# Patient Record
Sex: Female | Born: 1940 | Race: White | Hispanic: No | Smoking: Never smoker
Health system: Southern US, Community
[De-identification: ages and names within clinical notes are randomized; demographics above are authoritative.]

## PROBLEM LIST (undated history)

## (undated) DIAGNOSIS — E079 Disorder of thyroid, unspecified: Secondary | ICD-10-CM

## (undated) DIAGNOSIS — E119 Type 2 diabetes mellitus without complications: Secondary | ICD-10-CM

## (undated) DIAGNOSIS — I639 Cerebral infarction, unspecified: Secondary | ICD-10-CM

## (undated) DIAGNOSIS — F0390 Unspecified dementia without behavioral disturbance: Secondary | ICD-10-CM

## (undated) HISTORY — PX: ARTERIAL BYPASS SURGRY: SHX557

## (undated) HISTORY — PX: ABDOMINAL HYSTERECTOMY: SHX81

---

## 2005-01-14 ENCOUNTER — Ambulatory Visit: Payer: Self-pay | Admitting: Family Medicine

## 2005-01-16 ENCOUNTER — Ambulatory Visit: Payer: Self-pay | Admitting: Family Medicine

## 2005-04-23 ENCOUNTER — Ambulatory Visit: Payer: Self-pay | Admitting: Family Medicine

## 2006-07-31 ENCOUNTER — Emergency Department: Payer: Self-pay | Admitting: Emergency Medicine

## 2006-07-31 ENCOUNTER — Other Ambulatory Visit: Payer: Self-pay

## 2006-08-26 ENCOUNTER — Encounter: Payer: Self-pay | Admitting: Orthopedic Surgery

## 2006-09-15 ENCOUNTER — Encounter: Payer: Self-pay | Admitting: Orthopedic Surgery

## 2006-10-15 ENCOUNTER — Encounter: Payer: Self-pay | Admitting: Orthopedic Surgery

## 2006-11-15 ENCOUNTER — Encounter: Payer: Self-pay | Admitting: Orthopedic Surgery

## 2006-12-30 ENCOUNTER — Ambulatory Visit: Payer: Self-pay | Admitting: Family Medicine

## 2007-08-13 ENCOUNTER — Emergency Department: Payer: Self-pay | Admitting: Emergency Medicine

## 2007-08-19 ENCOUNTER — Emergency Department: Payer: Self-pay | Admitting: Emergency Medicine

## 2008-03-08 ENCOUNTER — Ambulatory Visit: Payer: Self-pay | Admitting: Internal Medicine

## 2008-03-30 ENCOUNTER — Ambulatory Visit: Payer: Self-pay | Admitting: Family Medicine

## 2008-05-03 ENCOUNTER — Inpatient Hospital Stay: Payer: Self-pay | Admitting: Vascular Surgery

## 2008-05-09 ENCOUNTER — Encounter: Payer: Self-pay | Admitting: Internal Medicine

## 2008-05-16 ENCOUNTER — Encounter: Payer: Self-pay | Admitting: Internal Medicine

## 2008-05-24 ENCOUNTER — Ambulatory Visit: Payer: Self-pay | Admitting: Family Medicine

## 2008-06-16 ENCOUNTER — Encounter: Payer: Self-pay | Admitting: Internal Medicine

## 2008-08-05 ENCOUNTER — Ambulatory Visit: Payer: Self-pay | Admitting: Family Medicine

## 2008-12-29 ENCOUNTER — Ambulatory Visit: Payer: Self-pay | Admitting: Family Medicine

## 2009-11-27 ENCOUNTER — Ambulatory Visit: Payer: Self-pay | Admitting: Family Medicine

## 2011-12-29 ENCOUNTER — Inpatient Hospital Stay: Payer: Self-pay | Admitting: Internal Medicine

## 2011-12-29 LAB — CBC
HGB: 12.5 g/dL (ref 12.0–16.0)
MCH: 29.2 pg (ref 26.0–34.0)
MCHC: 32.2 g/dL (ref 32.0–36.0)
MCV: 91 fL (ref 80–100)
RBC: 4.27 10*6/uL (ref 3.80–5.20)
RDW: 12.7 % (ref 11.5–14.5)
WBC: 10.5 10*3/uL (ref 3.6–11.0)

## 2011-12-29 LAB — URINALYSIS, COMPLETE
Glucose,UR: >=500 mg/dL (ref 0–75)
Ketone: NEGATIVE
Ph: 5 (ref 4.5–8.0)
Protein: 100
RBC,UR: 58 /HPF (ref 0–5)

## 2011-12-29 LAB — COMPREHENSIVE METABOLIC PANEL
Albumin: 2.9 g/dL — ABNORMAL LOW (ref 3.4–5.0)
Anion Gap: 11 (ref 7–16)
BUN: 29 mg/dL — ABNORMAL HIGH (ref 7–18)
Bilirubin,Total: 1.4 mg/dL — ABNORMAL HIGH (ref 0.2–1.0)
Chloride: 101 mmol/L (ref 98–107)
Co2: 26 mmol/L (ref 21–32)
Creatinine: 1.61 mg/dL — ABNORMAL HIGH (ref 0.60–1.30)
EGFR (African American): 37 — ABNORMAL LOW
Osmolality: 292 (ref 275–301)
Potassium: 4 mmol/L (ref 3.5–5.1)
SGOT(AST): 19 U/L (ref 15–37)
SGPT (ALT): 12 U/L
Total Protein: 7.2 g/dL (ref 6.4–8.2)

## 2011-12-29 LAB — DIFFERENTIAL
Basophil #: 0 10*3/uL (ref 0.0–0.1)
Basophil %: 0.3 %
Eosinophil #: 0 10*3/uL (ref 0.0–0.7)
Eosinophil %: 0 %
Lymphocyte #: 0.7 10*3/uL — ABNORMAL LOW (ref 1.0–3.6)
Monocyte #: 1 x10 3/mm — ABNORMAL HIGH (ref 0.2–0.9)
Neutrophil #: 8.8 10*3/uL — ABNORMAL HIGH (ref 1.4–6.5)

## 2011-12-30 LAB — BASIC METABOLIC PANEL
Anion Gap: 9 (ref 7–16)
BUN: 26 mg/dL — ABNORMAL HIGH (ref 7–18)
Creatinine: 1.6 mg/dL — ABNORMAL HIGH (ref 0.60–1.30)
EGFR (African American): 37 — ABNORMAL LOW
EGFR (Non-African Amer.): 32 — ABNORMAL LOW
Sodium: 140 mmol/L (ref 136–145)

## 2011-12-31 LAB — CBC WITH DIFFERENTIAL/PLATELET
Basophil #: 0 10*3/uL (ref 0.0–0.1)
Basophil %: 0.1 %
Eosinophil #: 0 10*3/uL (ref 0.0–0.7)
Eosinophil %: 0 %
HCT: 33.3 % — ABNORMAL LOW (ref 35.0–47.0)
Lymphocyte %: 8.2 %
MCV: 89 fL (ref 80–100)
Monocyte %: 9.4 %
Neutrophil #: 7.4 10*3/uL — ABNORMAL HIGH (ref 1.4–6.5)
Neutrophil %: 82.3 %
Platelet: 116 10*3/uL — ABNORMAL LOW (ref 150–440)
RBC: 3.74 10*6/uL — ABNORMAL LOW (ref 3.80–5.20)
RDW: 13 % (ref 11.5–14.5)
WBC: 9 10*3/uL (ref 3.6–11.0)

## 2011-12-31 LAB — BASIC METABOLIC PANEL
Calcium, Total: 7.8 mg/dL — ABNORMAL LOW (ref 8.5–10.1)
Chloride: 105 mmol/L (ref 98–107)
Co2: 23 mmol/L (ref 21–32)
Creatinine: 1.46 mg/dL — ABNORMAL HIGH (ref 0.60–1.30)
EGFR (African American): 41 — ABNORMAL LOW
Glucose: 237 mg/dL — ABNORMAL HIGH (ref 65–99)
Osmolality: 292 (ref 275–301)
Potassium: 3.8 mmol/L (ref 3.5–5.1)

## 2011-12-31 LAB — LIPID PANEL
HDL Cholesterol: 38 mg/dL — ABNORMAL LOW (ref 40–60)
Triglycerides: 90 mg/dL (ref 0–200)

## 2011-12-31 LAB — TSH: Thyroid Stimulating Horm: 10.7 u[IU]/mL — ABNORMAL HIGH

## 2011-12-31 LAB — HEMOGLOBIN A1C: Hemoglobin A1C: 6.7 % — ABNORMAL HIGH (ref 4.2–6.3)

## 2012-01-01 LAB — CBC WITH DIFFERENTIAL/PLATELET
Basophil #: 0 10*3/uL (ref 0.0–0.1)
Basophil %: 0.2 %
HCT: 34.6 % — ABNORMAL LOW (ref 35.0–47.0)
Lymphocyte #: 0.7 10*3/uL — ABNORMAL LOW (ref 1.0–3.6)
Lymphocyte %: 8.3 %
MCH: 29.9 pg (ref 26.0–34.0)
MCHC: 33.3 g/dL (ref 32.0–36.0)
Monocyte #: 0.9 x10 3/mm (ref 0.2–0.9)
Neutrophil #: 6.8 10*3/uL — ABNORMAL HIGH (ref 1.4–6.5)
RDW: 12.7 % (ref 11.5–14.5)
WBC: 8.4 10*3/uL (ref 3.6–11.0)

## 2012-01-01 LAB — BASIC METABOLIC PANEL
BUN: 26 mg/dL — ABNORMAL HIGH (ref 7–18)
Co2: 24 mmol/L (ref 21–32)
Creatinine: 1.57 mg/dL — ABNORMAL HIGH (ref 0.60–1.30)
EGFR (African American): 38 — ABNORMAL LOW
EGFR (Non-African Amer.): 33 — ABNORMAL LOW
Glucose: 216 mg/dL — ABNORMAL HIGH (ref 65–99)
Potassium: 3.9 mmol/L (ref 3.5–5.1)
Sodium: 137 mmol/L (ref 136–145)

## 2012-01-02 LAB — BASIC METABOLIC PANEL
Calcium, Total: 8.3 mg/dL — ABNORMAL LOW (ref 8.5–10.1)
Chloride: 101 mmol/L (ref 98–107)
Creatinine: 1.91 mg/dL — ABNORMAL HIGH (ref 0.60–1.30)
Osmolality: 302 (ref 275–301)
Potassium: 4.2 mmol/L (ref 3.5–5.1)
Sodium: 136 mmol/L (ref 136–145)

## 2012-01-02 LAB — URINE CULTURE

## 2012-01-04 LAB — CULTURE, BLOOD (SINGLE)

## 2012-01-21 ENCOUNTER — Ambulatory Visit: Payer: Self-pay | Admitting: Emergency Medicine

## 2012-01-21 LAB — BASIC METABOLIC PANEL
Anion Gap: 8 (ref 7–16)
Chloride: 103 mmol/L (ref 98–107)
Co2: 25 mmol/L (ref 21–32)
Creatinine: 1.83 mg/dL — ABNORMAL HIGH (ref 0.60–1.30)
EGFR (African American): 31 — ABNORMAL LOW
Osmolality: 280 (ref 275–301)
Sodium: 136 mmol/L (ref 136–145)

## 2012-01-21 LAB — URINALYSIS, COMPLETE
Bilirubin,UR: NEGATIVE
Glucose,UR: NEGATIVE mg/dL (ref 0–75)
Nitrite: POSITIVE
Ph: 6 (ref 4.5–8.0)
Specific Gravity: 1.025 (ref 1.003–1.030)

## 2012-01-21 LAB — CBC WITH DIFFERENTIAL/PLATELET
Basophil #: 0 10*3/uL (ref 0.0–0.1)
Basophil %: 0.4 %
Eosinophil #: 0 10*3/uL (ref 0.0–0.7)
Eosinophil %: 0.2 %
HCT: 39.3 % (ref 35.0–47.0)
HGB: 13.3 g/dL (ref 12.0–16.0)
Lymphocyte %: 16.8 %
MCH: 30.4 pg (ref 26.0–34.0)
MCHC: 33.7 g/dL (ref 32.0–36.0)
MCV: 90 fL (ref 80–100)
Monocyte #: 0.6 x10 3/mm (ref 0.2–0.9)
Neutrophil #: 5.4 10*3/uL (ref 1.4–6.5)
RBC: 4.36 10*6/uL (ref 3.80–5.20)
RDW: 13.2 % (ref 11.5–14.5)

## 2012-01-24 LAB — URINE CULTURE

## 2014-10-03 NOTE — H&P (Signed)
PATIENT NAME:  Shannon Todd, Shannon Todd MR#:  [id number] DATE OF BIRTH:  1941-03-07  DATE OF ADMISSION:  12/29/2011  PRIMARY CARE PHYSICIAN: Shannon Ripper. Shannon Kaufman, MD [facility])  CHIEF COMPLAINT: Left foot swelling, tenderness, and feeling feverish status post cat bite.   HISTORY OF PRESENT ILLNESS: Shannon Todd is a 74 year old pleasant Caucasian female with history of diabetes mellitus type 2 and systemic hypertension. She was in her usual state of health until Sunday morning when she stepped by accident on her cat's tail resulting in a cat bite on the dorsum of the left foot. Subsequently, gradually the area got swollen and became tender associated with redness and the patient started to feel feverish and today she became sick. she started to vomit as well and she feels dehydrated. The patient was admitted for further evaluation after finding of development of cellulitis on her left foot. Her blood work-up also showed elevated BUN and creatinine consistent with likely dehydration and prerenal azotemia. There is also evidence of urinary tract infection; however, the patient is asymptomatic.   REVIEW OF SYSTEMS: CONSTITUTIONAL: She indicated that she has fever, few chills as well. She has mild fatigue. EYES: No blurring of vision. No double vision. ENT: No hearing impairment. No sore throat. No dysphagia. CARDIOVASCULAR: No chest pain. No shortness of breath. No syncope. RESPIRATORY: No cough. No sputum production. No shortness of breath. No chest pain. GASTROINTESTINAL: No abdominal pain but reports nausea and she had one episode of vomiting today. No diarrhea. GENITOURINARY: Denies any dysuria. No frequency of urination, but she has chronic urinary incontinence. MUSCULOSKELETAL: No joint pain or swelling other than her left foot swelling. No muscular pain or swelling. INTEGUMENTARY: She has skin rash or cellulitis over the dorsum of the left foot. No ulcers other than the four puncture wound sites over the left foot. There  is associated swelling as well. NEUROLOGY: No focal weakness. No seizure activity. No headache. PSYCHIATRY: No anxiety. No depression. ENDOCRINE: No polyuria or polydipsia. No heat or cold intolerance.   PAST MEDICAL HISTORY:  1. Systemic hypertension.  2. Diabetes mellitus, type II.  3. Coronary artery disease status post coronary artery bypass graft in 2001.  4. Hyperlipidemia.  5. History of gout.  6. Hypothyroidism.  7. Peripheral neuropathy.   PAST SURGICAL HISTORY:  1. Coronary artery bypass graft done at [facility] in April 2001.  2. Partial hysterectomy.   SOCIAL HABITS: Nonsmoker. No history of alcohol or drug abuse.   SOCIAL HISTORY: She is widowed, lives with her daughter.   FAMILY HISTORY: Her father died from massive heart attack at the age of 8. Her mother also died at age of 53 in a nursing home. She is unsure about the cause of her death.   ADMISSION MEDICATIONS:  1. Levothyroxine 50 mcg once a day. 2. Citalopram 10 mg once a day. 3. Allopurinol 100 mg once a day.   ALLERGIES: Sulfa causes skin rash. All the medical records report that she is allergic to penicillin, but she is not. The patient had tolerated penicillin well in the past, as she indicates to me.   PHYSICAL EXAMINATION:   VITAL SIGNS: Blood pressure 175/74, respiratory rate 18, pulse 80, temperature 99.1, and oxygen saturation 97%.   GENERAL APPEARANCE: Shannon Todd lying in bed in no acute distress.   HEAD AND NECK EXAMINATION: No pallor. No icterus. No cyanosis.   EARS, NOSE, AND THROAT: Hearing was normal. Nasal mucosa, lips, and tongue were normal.   EYES: Normal eyelids  and conjunctivae. Pupils were about 6 mm, equal and reactive to light.   NECK: Supple. Trachea at midline. No thyromegaly. No cervical lymphadenopathy. No masses.   HEART: Normal S1 and S2. No S3 or S4. No murmur. No gallop. No carotid bruits.   RESPIRATORY: Normal breathing pattern without use of accessory muscles. No rales. No  wheezing.   ABDOMEN: Soft without tenderness. No hepatosplenomegaly. No masses. No hernias.   SKIN: No ulcers. There is redness over the dorsum of the left foot associated with soft tissue swelling and there are four puncture wounds. The area is also tender to touch.   NEUROLOGIC: Cranial nerves II through XII are intact. No focal motor deficit.   PSYCHIATRY: The patient is alert and oriented x3. Mood and affect were normal.   LABORATORY FINDINGS: Sugar 287, BUN 29, creatinine 1.6, sodium 138, and potassium 4. Her estimated GFR is 32. Total protein 7.2, albumin 2.9, and bilirubin 1.4. Normal liver transaminases. CBC showed white count of 10,000, hemoglobin 12.5, hematocrit 38, and platelet count 129.   Urinalysis showed more than 500 glucose, 58 red blood cells, 772 white blood cells, and +2 bacteria.   ASSESSMENT:  1. Left foot cellulitis secondary to cat bite. 2. Urinary tract infection.  3. Dehydration and evidence of mild acute renal failure secondary to prerenal azotemia.  4. Mild thrombocytopenia. 5. Diabetes mellitus type-2, uncontrolled.  6. Systemic hypertension, uncontrolled.  7. Coronary artery disease, status post coronary artery bypass graft in 2001.  8. Hyperlipidemia.  9. History of gout.  10. History of peripheral neuropathy.   PLAN: The patient will be admitted to the medical floor. IV antibiotic was ordered using Unasyn. Two blood cultures were ordered. I will send also urine for culture and sensitivity. Accu-Chek and sliding scale. I will add ACE inhibitor since she is diabetic and also for better blood pressure control. Zofran p.r.n. for her nausea and vomiting. Gentle IV hydration. Repeat basic metabolic profile tomorrow morning to follow up on the BUN and creatinine. The patient indicates that she does not have a Living Will, but she had given the power of attorney to her daughter, Shannon Todd.   TIME SPENT EVALUATING THIS PATIENT: More than 55 minutes including  reviewing her medical records.  ____________________________ Carney CornersAmir M. Rudene Rearwish, MD amd:slb D: 12/29/2011 23:01:00 ET T: 12/30/2011 09:17:25 ET JOB#: [id number]  cc: Shannon RipperPaul C. Shannon Kaufmanobin, MD Carney CornersAmir M. Rudene Rearwish, MD, <Dictator> Zollie ScaleAMIR M Mimi Debellis MD ELECTRONICALLY SIGNED 12/30/2011 23:14

## 2014-10-03 NOTE — Discharge Summary (Signed)
PATIENT NAME:  Shannon Todd, Edwena M MR#:  [id number] DATE OF BIRTH:  1941-04-18  DATE OF ADMISSION:  12/29/2011 DATE OF DISCHARGE:  01/02/2012  PRIMARY CARE PHYSICIAN: Unclear at this point. She does have a primary care physician but she does not remember the name.  FINAL DIAGNOSES:  1. Cat bite cellulitis and abscess of the left foot, status post incision and drainage. Pasteurella growing out of culture.  2. Urinary tract infection with Klebsiella oxytoca resistant to ampicillin but sensitive to other antibiotics.  3. Diabetes.  4. Hypertension.  5. Chronic kidney disease.  6. Depression.  7. Gout attack, right knee.  OPERATIONS: Incision and drainage of the foot by Dr. Alberteen Spindleline, podiatry. That was done on 12/30/2011.   MEDICATIONS ON DISCHARGE:  1. Lexapro 10 mg daily.  2. Allopurinol 100 mg daily.  3. Levothyroxine increased to 75 mcg daily.  4. Prednisone taper start 20 mg and taper by 5 mg daily until complete.  5. Percocet 5/325 mg one tablet every four hours as needed for pain. 6. Lisinopril 10 mg daily. 7. Metoprolol 25 mg 1/2 tablet every 12 hours. 8. Augmentin 500/125 mg one tablet twice a day until complete, five more days. 9. Levaquin 250 mg until complete, another four days.  10. Januvia 50 mg daily.  11. Sliding scale.  DISCHARGE INSTRUCTIONS: Followup in 1 to 2 days with the doctor at rehab. Needs physical therapy.   DIET: Low sodium, low fat, low cholesterol diet.  HISTORY OF PRESENT ILLNESS: The patient was admitted 12/29/2011 and discharged 01/02/2012. She came in after left foot swelling and tenderness status post cat bite. She was admitted with cat bite cellulitis, urinary tract infection acute, and acute on chronic renal failure.   LABORATORY, DIAGNOSTIC AND RADIOLOGIC DATA: Blood cultures were negative.   Glucose 287, BUN 29, creatinine 1.61, sodium 138, potassium 4.0, chloride 101, CO2 26, and calcium 8.2. Liver function tests okay. Albumin down at 2.9. White blood  cell count 10.5, hemoglobin and hematocrit 12.5 and 38.7, and platelet count 129.   Urinalysis positive.  Left foot complete showed radiopaque foreign body in lateral aspect of overlying calcaneus.  Wound culture grew out heavy growth of gram-negative rod, as per microbiology it is Pasteurella. They are just trying to figure out whether ti is Pasteurella canis or multocida.   Urine culture grew out greater than 100,000 Klebsiella oxytoca resistant to ampicillin.   LDL 140, HDL 38, and triglycerides 90. Creatinine upon discharge 1.9 and BUN 41.  Right knee x-ray: Mild degenerative changes.  Platelet count 122 and white count upon discharge 8.4. Hemoglobin A1c was 6.7. TSH was 10.7.   HOSPITAL COURSE PER PROBLEM LIST: 1. For the patient's cat bite cellulitis and abscess of the left foot, status post incision and drainage by Dr. Alberteen Spindleline. Follow-up next week with him. Most likely this is growing out Pasteurella. The patient was initially started on Unasyn. This was switched over to Augmentin, complete a 10 day course.  2. Urinary tract infection resistant to the Unasyn that the patient was on. Have to give Levaquin to cover this and will give five days of this, another four days at the rehab.  3. Diabetes. We will add low dose Januvia, sliding scale. The patient's sugars are up secondary to steroids  I gave for the gout attack of the right knee.  4. Hypertension. Lisinopril and metoprolol were given.  5. Chronic kidney disease. Creatinine bounced around between 1.5 and 1.9. This is chronic kidney disease. With starting  the lisinopril recommend checking a BMP next week. If creatinine continues to rise may have to get rid of lisinopril.  6. Depression. The patient is on Celexa.  7. For acute gout attack, the patient was started on a few doses of Solu-Medrol. We will give a quick prednisone taper. The patient is on allopurinol, which is already renally dosed.  8. For the patient's thrombocytopenia,  continue to follow as an outpatient.  TIME SPENT ON DISCHARGE: 40 minutes.  ____________________________ Herschell Dimes. Renae Gloss, MD rjw:slb D: 01/02/2012 10:12:38 ET T: 01/02/2012 10:25:20 ET JOB#: [id number]  cc: Herschell Dimes. Renae Gloss, MD, <Dictator> Salley Scarlet MD ELECTRONICALLY SIGNED 01/03/2012 16:04

## 2014-10-03 NOTE — Op Note (Signed)
PATIENT NAME:  Shannon Todd, Shannon Todd MR#:  [id number] DATE OF BIRTH:  [birth year]  DATE OF PROCEDURE:  12/30/2011  PREOPERATIVE DIAGNOSIS: Cat bite puncture wound left foot with abscess.   POSTOPERATIVE DIAGNOSIS: Cat bite puncture wound left foot with abscess.   PROCEDURE: Incision and drainage left foot.   SURGEON: Linus Galasodd Mong Neal, DPM  ANESTHESIA: General endotracheal.   HEMOSTASIS: Pneumatic tourniquet left calf, 250 mmHg.   ESTIMATED BLOOD LOSS: Minimal.   PATHOLOGY: None.   DRAINS: 4 x 4 gauze inserted into two incision openings.   COMPLICATIONS: None apparent.   OPERATIVE INDICATIONS: This is a 74 year old female who this past weekend received a cat bite to the top of her left foot. Began having significant swelling and redness with pain in the left foot. Had some nausea and vomiting and presented to the Emergency Department and was admitted for IV antibiotics and evaluation and debridement.   DESCRIPTION OF PROCEDURE: Patient was taken to the Operating Room and placed on the table in the supine position. Following satisfactory general endotracheal anesthesia, a pneumatic tourniquet was applied at the level of the left calf and the left foot and leg were prepped and draped in the usual sterile fashion. The foot was elevationally exsanguinated and the tourniquet inflated to 250 mmHg.   Attention was then directed to the dorsal aspect of the left foot where an approximate 4 cm linear incision was made coursing distal to proximal over the dorsum of the foot just medial to the cat bite puncture wounds. The incision was deepened and connected via hemostat to the actual puncture wounds. Did not appear to be any significant pockets of purulence or necrotic tissue. The wound was then explored proximally along the tendon area and no evidence of abscess was noted proximally. Next, a small stab incision was made just medial to the proximal puncture wound and this was explored using hemostats. Just a small  amount of devitalized tissue around the actual puncture wound but none deep and again no pockets of purulence. Next, a small stab incision was made along the lateral distal puncture wound approximately 6 mm in length. This was open using hemostats and explored. All three wounds were then flushed using copious amounts of sterile saline, 3 liters, under pulse lavage irrigation. The central 4 cm incision was then almost completely closed using 4-0 nylon simple interrupted sutures with a 6 mm gap left open at the distal puncture wound. One simple interrupted suture was then placed along the medial stab wound with an opening again left for packing. The lateral stab incision was left completely open. Sterile saline gauze was then packed into the two wounds that were left open distal and medial and Xeroform and a sterile gauze applied to the foot followed by an ABD and a Kerlix. An Ace wrap was then applied. The tourniquet was released and blood flow noted to return immediately to all of the digits. Prior to bandaging of the foot 9 mL of 0.5% Sensorcaine plain was then injected around the incisions for postoperative anesthesia. Patient tolerated the procedure and anesthesia well and was transported to the postanesthesia care unit with vital signs stable and in good condition.   ____________________________ Linus Galasodd Destin Kittler, DPM tc:cms D: 12/30/2011 21:24:08 ET T: 12/31/2011 09:27:28 ET JOB#: [id number]  cc: Linus Galasodd Anorah Trias, DPM, <Dictator>  Magaret Justo DPM ELECTRONICALLY SIGNED 01/22/2012 14:50

## 2014-10-03 NOTE — Consult Note (Signed)
PATIENT NAME:  Shannon Todd, Shannon Todd MR#:  [id number] DATE OF BIRTH:  Apr 02, 1941  DATE OF CONSULTATION:  12/30/2011  REFERRING PHYSICIAN:   CONSULTING PHYSICIAN:  Linus Galasodd Delron Comer, DPM  REASON FOR CONSULTATION: Shannon Todd is a 74 year old diabetic female who this past weekend accidentally stepped on her cat's tail and received a bite on the top of her left foot. Subsequently she noticed some redness and swelling, and started to notice some nausea and vomiting. She also feels dehydrated. She presented to the Emergency Room and was admitted for cellulitis and evaluation.   PAST MEDICAL HISTORY:  1. Diabetes mellitus type 2.  2. Systemic hypertension.  3. Coronary artery disease status post coronary artery bypass graft 2001.  4. Hyperlipidemia.  5. History of gout.  6. Hypothyroidism.  7. Peripheral neuropathy.   PAST SURGICAL HISTORY:  1. Coronary artery bypass grafting 2001. 2. Partial hysterectomy.   ADMISSION MEDICATIONS:  1. Levothyroxine 50 mcg once a day. 2. Citalopram 10 mg once a day.  3. Allopurinol 100 mg once a day.   ALLERGIES: Sulfa.   SOCIAL HISTORY: She is widowed, lives with her daughter. Denies alcohol or tobacco use.   FAMILY HISTORY: Heart disease.   REVIEW OF SYSTEMS: Swelling and redness in the left foot with recent cat bite to the top of the foot. She does relate some numbness and paresthesias associated with her diabetes. Recent nausea and vomiting. Denies any cough or shortness of breath. No chest pain. Does currently also have a urinary tract infection. No musculoskeletal pains or aches other than the left foot. No hearing or vision changes.  PHYSICAL EXAMINATION:  VASCULAR: DP and PT pulses are fully palpable. Capillary filling time within normal limits.   NEUROLOGICAL: There is some loss of protective threshold with monofilament wire distally in the toes. Proprioception is also impaired. There is pain response on compression of the left foot.   INTEGUMENT: Skin is  warm, dry, and mildly atrophic. There is significant edema and erythema in the left foot. Four puncture wounds are noted on the dorsum of the foot corresponding to the cat's teeth. Upon light debridement there is purulence that is expressed from each of the puncture wounds. A culture was taken for sensitivities.   MUSCULOSKELETAL: Guarded range of motion on the left. Pain on palpation and motion. Otherwise muscle testing and range of motion is within normal limits.   X-RAYS:  Normal joint space pattern throughout the foot. There is noted to be a retained needle foreign body at the plantar aspect of the calcaneus. This is not correlating with her clinical area of infection. No bony destruction or lytic lesions are noted.   LAB WORK:  The patient is admitted with white count in the normal range at the high end, but she does have a left shift in her neutrophils. Platelet count is mildly decreased at 129.   IMPRESSION:  1. Cat bite with cellulitis and abscess left foot.  2. Diabetes with neuropathy.  3. Retained foreign body left heel. No correlation with her current admission.   PLAN: Opened up the areas and a culture was taken for sensitivities. Betadine and a light dressing was applied. I spoke with the patient and a couple of her daughters about the need to open up the puncture wounds to drain any retained purulence. We will plan on this later on this evening. N.p.o. at this point and we will obtain a consent form for incision and drainage.      We will follow  her accordingly following her incision and drainage.     ____________________________ Linus Galas, DPM tc:bjt D: 12/30/2011 13:15:33 ET T: 12/30/2011 14:20:02 ET JOB#: [id number]  cc: Linus Galas, DPM, <Dictator> Calla Wedekind DPM ELECTRONICALLY SIGNED 12/31/2011 7:43

## 2015-04-02 ENCOUNTER — Ambulatory Visit
Admission: EM | Admit: 2015-04-02 | Discharge: 2015-04-02 | Disposition: A | Discharge status: Home / Self Care | Payer: Medicare Other | Source: Home / Self Care | Attending: Family Medicine | Admitting: Family Medicine

## 2015-04-02 ENCOUNTER — Encounter: Payer: Self-pay | Admitting: *Deleted

## 2015-04-02 ENCOUNTER — Emergency Department
Admission: EM | Admit: 2015-04-02 | Discharge: 2015-04-03 | Disposition: A | Discharge status: Home / Self Care | Payer: Medicare Other | Attending: Emergency Medicine | Admitting: Emergency Medicine

## 2015-04-02 DIAGNOSIS — R4182 Altered mental status, unspecified: Secondary | ICD-10-CM | POA: Diagnosis not present

## 2015-04-02 DIAGNOSIS — Z7902 Long term (current) use of antithrombotics/antiplatelets: Secondary | ICD-10-CM | POA: Insufficient documentation

## 2015-04-02 DIAGNOSIS — Z7984 Long term (current) use of oral hypoglycemic drugs: Secondary | ICD-10-CM | POA: Diagnosis not present

## 2015-04-02 DIAGNOSIS — Z7982 Long term (current) use of aspirin: Secondary | ICD-10-CM | POA: Diagnosis not present

## 2015-04-02 DIAGNOSIS — N390 Urinary tract infection, site not specified: Secondary | ICD-10-CM | POA: Insufficient documentation

## 2015-04-02 DIAGNOSIS — R350 Frequency of micturition: Secondary | ICD-10-CM | POA: Diagnosis present

## 2015-04-02 DIAGNOSIS — E119 Type 2 diabetes mellitus without complications: Secondary | ICD-10-CM | POA: Diagnosis not present

## 2015-04-02 DIAGNOSIS — Z79899 Other long term (current) drug therapy: Secondary | ICD-10-CM | POA: Insufficient documentation

## 2015-04-02 HISTORY — DX: Disorder of thyroid, unspecified: E07.9

## 2015-04-02 HISTORY — DX: Unspecified dementia, unspecified severity, without behavioral disturbance, psychotic disturbance, mood disturbance, and anxiety: F03.90

## 2015-04-02 HISTORY — DX: Cerebral infarction, unspecified: I63.9

## 2015-04-02 HISTORY — DX: Type 2 diabetes mellitus without complications: E11.9

## 2015-04-02 LAB — CBC
HCT: 37.2 % (ref 35.0–47.0)
HEMOGLOBIN: 12.3 g/dL (ref 12.0–16.0)
MCH: 30.9 pg (ref 26.0–34.0)
MCHC: 33.2 g/dL (ref 32.0–36.0)
MCV: 93.1 fL (ref 80.0–100.0)
PLATELETS: 153 10*3/uL (ref 150–440)
RBC: 4 MIL/uL (ref 3.80–5.20)
RDW: 14.5 % (ref 11.5–14.5)
WBC: 9 10*3/uL (ref 3.6–11.0)

## 2015-04-02 LAB — URINALYSIS COMPLETE WITH MICROSCOPIC (ARMC ONLY)
Bilirubin Urine: NEGATIVE
Glucose, UA: NEGATIVE mg/dL
Ketones, ur: NEGATIVE mg/dL
Nitrite: NEGATIVE
PROTEIN: 100 mg/dL — AB
SPECIFIC GRAVITY, URINE: 1.016 (ref 1.005–1.030)
pH: 5 (ref 5.0–8.0)

## 2015-04-02 LAB — BASIC METABOLIC PANEL
Anion gap: 9 (ref 5–15)
BUN: 37 mg/dL — AB (ref 6–20)
CHLORIDE: 102 mmol/L (ref 101–111)
CO2: 26 mmol/L (ref 22–32)
CREATININE: 1.89 mg/dL — AB (ref 0.44–1.00)
Calcium: 9.1 mg/dL (ref 8.9–10.3)
GFR, EST AFRICAN AMERICAN: 29 mL/min — AB (ref >60)
GFR, EST NON AFRICAN AMERICAN: 25 mL/min — AB (ref >60)
Glucose, Bld: 104 mg/dL — ABNORMAL HIGH (ref 65–99)
POTASSIUM: 3.9 mmol/L (ref 3.5–5.1)
SODIUM: 137 mmol/L (ref 135–145)

## 2015-04-02 MED ORDER — CEPHALEXIN 500 MG PO CAPS: 500.0000 mg | ORAL_CAPSULE | Freq: Three times a day (TID) | ORAL | Status: DC

## 2015-04-02 MED ORDER — CEPHALEXIN 500 MG PO CAPS
500.0000 mg | ORAL_CAPSULE | Freq: Once | ORAL | Status: AC
  Administered 2015-04-03: 500 mg via ORAL
  Filled 2015-04-02: qty 1

## 2015-04-02 NOTE — ED Provider Notes (Signed)
[facility] Emergency Department Provider Note  Time seen: 9:40 PM  I have reviewed the triage vital signs and the nursing notes.   HISTORY  Chief Complaint Urinary Frequency    HPI Maryruth BunJanice M Constancio is a 74 y.o. female with a past medical history of diabetes, CVA, dementia, presents to the emergency department for a possible urinary tract infection. The patient lives with her daughter, according to the family the past several days they've noted a very strong and foul-smelling odor to her urine. Patient denies any dysuria, fever, nausea, vomiting, diarrhea, abdominal pain. Patient denies any symptoms at all, but the daughter states there is definitely very foul odor to her urine.    Past Medical History  Diagnosis Date  . Diabetes mellitus without complication (HCC)   . Thyroid disease   . Stroke (HCC)   . Dementia     There are no active problems to display for this patient.   Past Surgical History  Procedure Laterality Date  . Arterial bypass surgry    . Abdominal hysterectomy      Current Outpatient Rx  Name  Route  Sig  Dispense  Refill  . allopurinol (ZYLOPRIM) 300 MG tablet   Oral   Take 300 mg by mouth daily.         Marland Kitchen. amLODipine (NORVASC) 10 MG tablet   Oral   Take 10 mg by mouth daily.         Marland Kitchen. aspirin 81 MG tablet   Oral   Take 81 mg by mouth daily.         . Cholecalciferol (VITAMIN D3) 1000 UNITS CAPS   Oral   Take by mouth.         . citalopram (CELEXA) 10 MG tablet   Oral   Take 10 mg by mouth daily.         . clopidogrel (PLAVIX) 75 MG tablet   Oral   Take 75 mg by mouth daily.         Marland Kitchen. dicyclomine (BENTYL) 20 MG tablet   Oral   Take 20 mg by mouth 3 (three) times daily before meals.         . donepezil (ARICEPT) 23 MG TABS tablet   Oral   Take 23 mg by mouth at bedtime.         Marland Kitchen. glipiZIDE (GLUCOTROL XL) 2.5 MG 24 hr tablet   Oral   Take 2.5 mg by mouth daily with breakfast.         .  haloperidol (HALDOL) 0.5 MG tablet   Oral   Take 0.5 mg by mouth 2 (two) times daily. 1-2 tablets by mouth every evening for confusion         . levothyroxine (SYNTHROID, LEVOTHROID) 112 MCG tablet   Oral   Take 112 mcg by mouth daily before breakfast.         . memantine (NAMENDA XR) 7 MG CP24 24 hr capsule   Oral   Take 7 mg by mouth daily.         Marland Kitchen. omeprazole (PRILOSEC) 20 MG capsule   Oral   Take 20 mg by mouth daily.         Marland Kitchen. oxybutynin (DITROPAN) 5 MG tablet   Oral   Take 5 mg by mouth 2 (two) times daily.         . promethazine (PHENERGAN) 12.5 MG tablet   Oral   Take 12.5 mg by mouth every  6 (six) hours as needed for nausea or vomiting.         . simvastatin (ZOCOR) 40 MG tablet   Oral   Take 40 mg by mouth daily.           Allergies Sulfa antibiotics  No family history on file.  Social History Social History  Substance Use Topics  . Smoking status: Never Smoker   . Smokeless tobacco: Not on file  . Alcohol Use: No    Review of Systems Constitutional: Negative for fever. Cardiovascular: Negative for chest pain. Respiratory: Negative for shortness of breath. Gastrointestinal: Negative for abdominal pain, nausea, vomiting, diarrhea. Genitourinary: Negative for dysuria. Positive for foul smell. Musculoskeletal: Negative for back pain. 10-point ROS otherwise negative.  ____________________________________________   PHYSICAL EXAM:  VITAL SIGNS: ED Triage Vitals  Enc Vitals Group     BP 04/02/15 1942 170/87 mmHg     Pulse Rate 04/02/15 1942 87     Resp 04/02/15 1942 18     Temp 04/02/15 1942 98.6 F (37 C)     Temp Source 04/02/15 1942 Oral     SpO2 04/02/15 1942 96 %     Weight 04/02/15 1942 200 lb (90.719 kg)     Height --      Head Cir --      Peak Flow --      Pain Score --      Pain Loc --      Pain Edu? --      Excl. in GC? --    Constitutional: Alert. Well appearing and in no distress. Eyes: Normal exam ENT    Head: Normocephalic and atraumatic.   Mouth/Throat: Mucous membranes are moist. Cardiovascular: Normal rate, regular rhythm. No murmur Respiratory: Normal respiratory effort without tachypnea nor retractions. Breath sounds are clear Gastrointestinal: Soft and nontender. No distention.   Neurologic:  Normal speech and language. No gross focal neurologic deficits Skin:  Skin is warm, dry and intact.  Psychiatric: Mood and affect are normal. Speech and behavior are normal. ____________________________________________    INITIAL IMPRESSION / ASSESSMENT AND PLAN / ED COURSE  Pertinent labs & imaging results that were available during my care of the patient were reviewed by me and considered in my medical decision making (see chart for details).  Patient presents for possible urinary tract infection. Labs are largely within normal limits, currently awaiting urinalysis results. Nontender abdominal exam. Patient denies any pain.  Urinalysis consistent with urinary tract infection. We'll discharge on antibiotics. Family agreeable to plan.   ____________________________________________   FINAL CLINICAL IMPRESSION(S) / ED DIAGNOSES  Urinary tract infection   Minna Antis, MD 04/02/15 2316

## 2015-04-02 NOTE — Discharge Instructions (Signed)

## 2015-04-02 NOTE — ED Notes (Signed)
Report called to Tammy SoursGreg RN at [facility] ED.

## 2015-04-02 NOTE — ED Notes (Signed)
Pt unable to obtain urine specimen at this time 

## 2015-04-02 NOTE — ED Notes (Signed)
Pt in with co foul smelling urine today, has also had increased sleeping and confusion.

## 2015-04-02 NOTE — ED Notes (Signed)
Pt's daughter states that pt has been sleeping a lot, has foul smelling urine, wetting the bed and extreme confusion, outside of her normal dementia.  Started less than a week ago

## 2015-04-02 NOTE — ED Provider Notes (Signed)
CSN: [id number]     Arrival date & time 04/02/15  1745 History   None    Chief Complaint  Patient presents with  . Altered Mental Status  . Nocturnal Enuresis   (Consider location/radiation/quality/duration/timing/severity/associated sxs/prior Treatment) HPI Comments: 74 yo female with a h/o diabetes, dementia, stroke presents with daughter who brings patient due to an approximately 4-5 days h/o worsening confusion, bedwetting and foul smelling urine. Daughter reports patient has also been sleeping a lot; more than usual. Daughter states patient has had prior admissions to the hospital with similar symptoms and UTI.  Patient is a 74 y.o. female presenting with altered mental status. The history is provided by a relative.  Altered Mental Status   Past Medical History  Diagnosis Date  . Diabetes mellitus without complication (HCC)   . Thyroid disease   . Stroke (HCC)   . Dementia    Past Surgical History  Procedure Laterality Date  . Arterial bypass surgry    . Abdominal hysterectomy     No family history on file. Social History  Substance Use Topics  . Smoking status: Never Smoker   . Smokeless tobacco: None  . Alcohol Use: No   OB History    No data available     Review of Systems  Allergies  Sulfa antibiotics  Home Medications   Prior to Admission medications   Medication Sig Start Date End Date Taking? Authorizing Provider  allopurinol (ZYLOPRIM) 300 MG tablet Take 300 mg by mouth daily.   Yes Historical Provider, MD  amLODipine (NORVASC) 10 MG tablet Take 10 mg by mouth daily.   Yes Historical Provider, MD  aspirin 81 MG tablet Take 81 mg by mouth daily.   Yes Historical Provider, MD  Cholecalciferol (VITAMIN D3) 1000 UNITS CAPS Take by mouth.   Yes Historical Provider, MD  citalopram (CELEXA) 10 MG tablet Take 10 mg by mouth daily.   Yes Historical Provider, MD  clopidogrel (PLAVIX) 75 MG tablet Take 75 mg by mouth daily.   Yes Historical Provider, MD   dicyclomine (BENTYL) 20 MG tablet Take 20 mg by mouth 3 (three) times daily before meals.   Yes Historical Provider, MD  donepezil (ARICEPT) 23 MG TABS tablet Take 23 mg by mouth at bedtime.   Yes Historical Provider, MD  glipiZIDE (GLUCOTROL XL) 2.5 MG 24 hr tablet Take 2.5 mg by mouth daily with breakfast.   Yes Historical Provider, MD  haloperidol (HALDOL) 0.5 MG tablet Take 0.5 mg by mouth 2 (two) times daily. 1-2 tablets by mouth every evening for confusion   Yes Historical Provider, MD  levothyroxine (SYNTHROID, LEVOTHROID) 112 MCG tablet Take 112 mcg by mouth daily before breakfast.   Yes Historical Provider, MD  memantine (NAMENDA XR) 7 MG CP24 24 hr capsule Take 7 mg by mouth daily.   Yes Historical Provider, MD  omeprazole (PRILOSEC) 20 MG capsule Take 20 mg by mouth daily.   Yes Historical Provider, MD  oxybutynin (DITROPAN) 5 MG tablet Take 5 mg by mouth 2 (two) times daily.   Yes Historical Provider, MD  promethazine (PHENERGAN) 12.5 MG tablet Take 12.5 mg by mouth every 6 (six) hours as needed for nausea or vomiting.   Yes Historical Provider, MD  simvastatin (ZOCOR) 40 MG tablet Take 40 mg by mouth daily.   Yes Historical Provider, MD   Meds Ordered and Administered this Visit  Medications - No data to display  BP 140/62 mmHg  Pulse 92  Temp(Src) 98.2 F (  36.8 C) (Tympanic)  Ht  (1.727 m)  Wt 200 lb (90.719 kg)  BMI 30.42 kg/m2 No data found.   Physical Exam  Constitutional: She appears well-developed and well-nourished. No distress.  Cardiovascular: Normal rate.   Pulmonary/Chest: Effort normal. No respiratory distress.  Abdominal: Soft. She exhibits no distension.  Neurological: She is alert.  Oriented to person only  Skin: She is not diaphoretic.  Nursing note and vitals reviewed.   ED Course  Procedures (including critical care time)  Labs Review Labs Reviewed - No data to display  Imaging Review No results found.   Visual Acuity Review  Right  Eye Distance:   Left Eye Distance:   Bilateral Distance:    Right Eye Near:   Left Eye Near:    Bilateral Near:         MDM   1. Altered mental status, unspecified altered mental status type    Unable to obtain urine sample. Possible etiologies discussed with daughter and recommended that due to her underlying chronic health problems and now acute worsening in mental status, patient go to [facility] ED for further evaluation/managmennt. Daughter verbalizes understanding and will transport patient to [facility] ED. [facility] triage RN notified.     Payton Mccallum, MD 04/02/15 [phone]

## 2015-05-26 ENCOUNTER — Emergency Department: Payer: Medicare Other

## 2015-05-26 ENCOUNTER — Encounter: Payer: Self-pay | Admitting: *Deleted

## 2015-05-26 ENCOUNTER — Emergency Department
Admission: EM | Admit: 2015-05-26 | Discharge: 2015-05-27 | Disposition: A | Discharge status: Home / Self Care | Payer: Medicare Other | Attending: Emergency Medicine | Admitting: Emergency Medicine

## 2015-05-26 DIAGNOSIS — Y92009 Unspecified place in unspecified non-institutional (private) residence as the place of occurrence of the external cause: Secondary | ICD-10-CM | POA: Insufficient documentation

## 2015-05-26 DIAGNOSIS — Y998 Other external cause status: Secondary | ICD-10-CM | POA: Diagnosis not present

## 2015-05-26 DIAGNOSIS — T1490XA Injury, unspecified, initial encounter: Secondary | ICD-10-CM

## 2015-05-26 DIAGNOSIS — Z79899 Other long term (current) drug therapy: Secondary | ICD-10-CM | POA: Insufficient documentation

## 2015-05-26 DIAGNOSIS — Z7982 Long term (current) use of aspirin: Secondary | ICD-10-CM | POA: Insufficient documentation

## 2015-05-26 DIAGNOSIS — S7002XA Contusion of left hip, initial encounter: Secondary | ICD-10-CM | POA: Diagnosis not present

## 2015-05-26 DIAGNOSIS — Y9389 Activity, other specified: Secondary | ICD-10-CM | POA: Insufficient documentation

## 2015-05-26 DIAGNOSIS — W1839XA Other fall on same level, initial encounter: Secondary | ICD-10-CM | POA: Insufficient documentation

## 2015-05-26 DIAGNOSIS — Z7902 Long term (current) use of antithrombotics/antiplatelets: Secondary | ICD-10-CM | POA: Diagnosis not present

## 2015-05-26 DIAGNOSIS — S79912A Unspecified injury of left hip, initial encounter: Secondary | ICD-10-CM | POA: Diagnosis present

## 2015-05-26 DIAGNOSIS — E119 Type 2 diabetes mellitus without complications: Secondary | ICD-10-CM | POA: Diagnosis not present

## 2015-05-26 MED ORDER — TRAMADOL HCL 50 MG PO TABS
50.0000 mg | ORAL_TABLET | Freq: Once | ORAL | Status: AC
Start: 2015-05-26 — End: 2015-05-26
  Administered 2015-05-26: 50 mg via ORAL
  Filled 2015-05-26: qty 1

## 2015-05-26 MED ORDER — TRAMADOL HCL 50 MG PO TABS: 50.0000 mg | ORAL_TABLET | Freq: Four times a day (QID) | ORAL | Status: AC | PRN

## 2015-05-26 NOTE — ED Notes (Signed)
PT fell at home, landed on left side, no LOC reported, no shortening or rotation of left lower extremity, 7 in x 8 in.  Firm area at left hip.

## 2015-05-26 NOTE — ED Provider Notes (Signed)
Time Seen: Approximately ----------------------------------------- 11:29 PM on 05/26/2015 -----------------------------------------    I have reviewed the triage notes  Chief Complaint: Fall and Hip Pain   History of Present Illness: Shannon Todd is a 74 y.o. female who presents after EMS transport from home. Patient apparently was going to the bathroom which she apparently got tangled up in a tight space and fell landing primarily on her left side. Initially she had described pain to her family of left arm and chest discomfort after the fall but then the pain seemed to be more localized towards the left hip region. She was able to bear weight this seemed to have increasing discomfort. She currently denies any left shoulder pain or chest discomfort after the fall. There was no syncopal episode and no head trauma.  Patient has a history of dementia and most of her history and review of systems as described by the family. She is able to answer some questions appropriately. Past Medical History  Diagnosis Date  . Diabetes mellitus without complication (HCC)   . Thyroid disease   . Stroke (HCC)   . Dementia     There are no active problems to display for this patient.   Past Surgical History  Procedure Laterality Date  . Arterial bypass surgry    . Abdominal hysterectomy      Past Surgical History  Procedure Laterality Date  . Arterial bypass surgry    . Abdominal hysterectomy      Current Outpatient Rx  Name  Route  Sig  Dispense  Refill  . allopurinol (ZYLOPRIM) 300 MG tablet   Oral   Take 300 mg by mouth daily.         Marland Kitchen. amLODipine (NORVASC) 10 MG tablet   Oral   Take 10 mg by mouth daily.         Marland Kitchen. aspirin 81 MG tablet   Oral   Take 81 mg by mouth daily.         . Cholecalciferol (VITAMIN D3) 1000 UNITS CAPS   Oral   Take by mouth.         . citalopram (CELEXA) 10 MG tablet   Oral   Take 10 mg by mouth daily.         . clopidogrel (PLAVIX) 75  MG tablet   Oral   Take 75 mg by mouth daily.         Marland Kitchen. dicyclomine (BENTYL) 20 MG tablet   Oral   Take 20 mg by mouth 3 (three) times daily before meals.         . donepezil (ARICEPT) 23 MG TABS tablet   Oral   Take 23 mg by mouth at bedtime.         Marland Kitchen. glipiZIDE (GLUCOTROL XL) 2.5 MG 24 hr tablet   Oral   Take 2.5 mg by mouth daily with breakfast.         . haloperidol (HALDOL) 0.5 MG tablet   Oral   Take 0.5 mg by mouth 2 (two) times daily. 1-2 tablets by mouth every evening for confusion         . levothyroxine (SYNTHROID, LEVOTHROID) 112 MCG tablet   Oral   Take 112 mcg by mouth daily before breakfast.         . memantine (NAMENDA XR) 7 MG CP24 24 hr capsule   Oral   Take 7 mg by mouth daily.         Marland Kitchen. omeprazole (PRILOSEC)  20 MG capsule   Oral   Take 20 mg by mouth daily.         Marland Kitchen oxybutynin (DITROPAN) 5 MG tablet   Oral   Take 5 mg by mouth 2 (two) times daily.         . promethazine (PHENERGAN) 12.5 MG tablet   Oral   Take 12.5 mg by mouth every 6 (six) hours as needed for nausea or vomiting.         . simvastatin (ZOCOR) 40 MG tablet   Oral   Take 40 mg by mouth daily.           Allergies:  Sulfa antibiotics  Family History: History reviewed. No pertinent family history.  Social History: Social History  Substance Use Topics  . Smoking status: Never Smoker   . Smokeless tobacco: Never Used  . Alcohol Use: No     Review of Systems:   10 point review of systems was performed and was otherwise negative: Review of systems mainly acquired from the family Constitutional: No fever Eyes: No visual disturbances ENT: No sore throat, ear pain Cardiac: No chest pain Respiratory: No shortness of breath, wheezing, or stridor Abdomen: No abdominal pain, no vomiting, No diarrhea Endocrine: No weight loss, No night sweats Extremities: No peripheral edema, cyanosis Skin: No rashes, easy bruising Neurologic: No focal weakness,  trouble with speech or swollowing Urologic: No dysuria, Hematuria, or urinary frequency   Physical Exam:  ED Triage Vitals  Enc Vitals Group     BP 05/26/15 2222 178/71 mmHg     Pulse Rate 05/26/15 2222 62     Resp --      Temp 05/26/15 2222 98.2 F (36.8 C)     Temp Source 05/26/15 2222 Oral     SpO2 05/26/15 2222 95 %     Weight 05/26/15 2222 160 lb (72.576 kg)     Height 05/26/15 2222  (1.702 m)     Head Cir --      Peak Flow --      Pain Score 05/26/15 2217 6     Pain Loc --      Pain Edu? --      Excl. in GC? --     General: Awake , Alert , and Oriented times 2. Glasgow Coma Scale 15 Head: Normal cephalic , atraumatic Eyes: Pupils equal , round, reactive to light Nose/Throat: No nasal drainage, patent upper airway without erythema or exudate.  Neck: Supple, Full range of motion, No anterior adenopathy or palpable thyroid masses. Neck is supple without any midline cervical spine tenderness no neuropraxia Lungs: Clear to ascultation without wheezes , rhonchi, or rales Heart: Regular rate, regular rhythm without murmurs , gallops , or rubs Abdomen: Soft, non tender without rebound, guarding , or rigidity; bowel sounds positive and symmetric in all 4 quadrants. No organomegaly .        Extremities: Patient has normal length of both lower extremities. She has some tenderness over the left fascia region with a palpable large hematoma. There is no crepitus or step-off noted. Neurologic: normal ambulation, Motor symmetric without deficits, sensory intact Skin: warm, dry, no rashes   Labs:   All laboratory work was reviewed including any pertinent negatives or positives listed below:  Labs Reviewed  CBC WITH DIFFERENTIAL/PLATELET  COMPREHENSIVE METABOLIC PANEL     Radiology:   CLINICAL DATA: Fall in the bathroom injuring left side. Bruising and swelling laterally about the hip.  EXAM: DG HIP (  WITH OR WITHOUT PELVIS) 2-3V LEFT  COMPARISON:  None.  FINDINGS: The cortical margins of the bony pelvis and left hip are intact. No fracture. Pubic symphysis and sacroiliac joints are congruent. Both femoral heads are well-seated in the respective acetabula. Mild degenerative change of the left hip with acetabular spurring. Soft tissue prominence involving the lateral subcutaneous tissues consistent with hematoma. Vascular calcifications are seen.  IMPRESSION: 1. No fracture dislocation of the pelvis or left hip. 2. Soft tissue hematoma laterally.   Electronically Signed By: Rubye Oaks M.D. On: 05/26/2015 23:17          DG FEMUR MIN 2 VIEWS LEFT (Final result) Result time: 05/26/15 23:16:57   Procedure changed from [facility] FEMUR 1V LEFT      Final result by Rad Results In Interface (05/26/15 23:16:57)   Narrative:   CLINICAL DATA: Fall on left thigh with pain, initial encounter  EXAM: LEFT FEMUR 2 VIEWS  COMPARISON: None.  FINDINGS: No acute fracture or dislocation is noted. Diffuse vascular calcifications are noted. Soft tissue swelling is noted laterally about the hip consistent with localized edema and likely hemorrhage.  IMPRESSION: Posttraumatic soft tissue injury without acute bony abnormality.         I personally reviewed the radiologic studies    ED Course: * Patient apparently based on clinical exam and also x-ray evaluation has not suffered any bony injury. She does have a large left-sided hematoma patient will be prescribed pain medication for home and we discussed moist warm heat at home to help circulate the hematoma. She most likely will have significant discomfort and the family should return here if she has inability to ambulate.    Assessment:  Left lower extremity hematoma Non-syncopal fall  Final Clinical Impression:   Final diagnoses:  Trauma     Plan: * Outpatient management Patient was advised to return immediately if condition worsens. Patient was advised  to follow up with their primary care physician or other specialized physicians involved in their outpatient care             Jennye Moccasin, MD 05/26/15 2334

## 2015-08-02 ENCOUNTER — Emergency Department: Payer: Medicare Other

## 2015-08-02 ENCOUNTER — Encounter: Payer: Self-pay | Admitting: Emergency Medicine

## 2015-08-02 ENCOUNTER — Inpatient Hospital Stay
Admission: EM | Admit: 2015-08-02 | Discharge: 2015-08-06 | DRG: 097 | Disposition: A | Discharge status: Skilled Nursing Facility | Payer: Medicare Other | Attending: Internal Medicine | Admitting: Internal Medicine

## 2015-08-02 DIAGNOSIS — I1310 Hypertensive heart and chronic kidney disease without heart failure, with stage 1 through stage 4 chronic kidney disease, or unspecified chronic kidney disease: Secondary | ICD-10-CM | POA: Diagnosis present

## 2015-08-02 DIAGNOSIS — N3281 Overactive bladder: Secondary | ICD-10-CM | POA: Diagnosis present

## 2015-08-02 DIAGNOSIS — E1122 Type 2 diabetes mellitus with diabetic chronic kidney disease: Secondary | ICD-10-CM | POA: Diagnosis present

## 2015-08-02 DIAGNOSIS — G9341 Metabolic encephalopathy: Secondary | ICD-10-CM | POA: Diagnosis present

## 2015-08-02 DIAGNOSIS — N390 Urinary tract infection, site not specified: Secondary | ICD-10-CM | POA: Diagnosis present

## 2015-08-02 DIAGNOSIS — Z8673 Personal history of transient ischemic attack (TIA), and cerebral infarction without residual deficits: Secondary | ICD-10-CM

## 2015-08-02 DIAGNOSIS — Z79899 Other long term (current) drug therapy: Secondary | ICD-10-CM

## 2015-08-02 DIAGNOSIS — A86 Unspecified viral encephalitis: Secondary | ICD-10-CM | POA: Diagnosis not present

## 2015-08-02 DIAGNOSIS — N179 Acute kidney failure, unspecified: Secondary | ICD-10-CM | POA: Diagnosis present

## 2015-08-02 DIAGNOSIS — Z7982 Long term (current) use of aspirin: Secondary | ICD-10-CM

## 2015-08-02 DIAGNOSIS — Z882 Allergy status to sulfonamides status: Secondary | ICD-10-CM

## 2015-08-02 DIAGNOSIS — E785 Hyperlipidemia, unspecified: Secondary | ICD-10-CM | POA: Diagnosis present

## 2015-08-02 DIAGNOSIS — E039 Hypothyroidism, unspecified: Secondary | ICD-10-CM | POA: Diagnosis present

## 2015-08-02 DIAGNOSIS — E875 Hyperkalemia: Secondary | ICD-10-CM | POA: Diagnosis present

## 2015-08-02 DIAGNOSIS — J9601 Acute respiratory failure with hypoxia: Secondary | ICD-10-CM | POA: Diagnosis present

## 2015-08-02 DIAGNOSIS — I509 Heart failure, unspecified: Secondary | ICD-10-CM

## 2015-08-02 DIAGNOSIS — F0280 Dementia in other diseases classified elsewhere without behavioral disturbance: Secondary | ICD-10-CM | POA: Diagnosis present

## 2015-08-02 DIAGNOSIS — R4182 Altered mental status, unspecified: Secondary | ICD-10-CM | POA: Diagnosis present

## 2015-08-02 DIAGNOSIS — Z90710 Acquired absence of both cervix and uterus: Secondary | ICD-10-CM

## 2015-08-02 DIAGNOSIS — J189 Pneumonia, unspecified organism: Secondary | ICD-10-CM | POA: Diagnosis present

## 2015-08-02 DIAGNOSIS — M109 Gout, unspecified: Secondary | ICD-10-CM | POA: Diagnosis present

## 2015-08-02 DIAGNOSIS — G309 Alzheimer's disease, unspecified: Secondary | ICD-10-CM | POA: Diagnosis present

## 2015-08-02 DIAGNOSIS — I5023 Acute on chronic systolic (congestive) heart failure: Secondary | ICD-10-CM | POA: Diagnosis present

## 2015-08-02 DIAGNOSIS — Z20828 Contact with and (suspected) exposure to other viral communicable diseases: Secondary | ICD-10-CM | POA: Diagnosis present

## 2015-08-02 DIAGNOSIS — N184 Chronic kidney disease, stage 4 (severe): Secondary | ICD-10-CM | POA: Diagnosis present

## 2015-08-02 LAB — COMPREHENSIVE METABOLIC PANEL
ALK PHOS: 57 U/L (ref 38–126)
ALT: 8 U/L — ABNORMAL LOW (ref 14–54)
ANION GAP: 6 (ref 5–15)
AST: 24 U/L (ref 15–41)
Albumin: 2.6 g/dL — ABNORMAL LOW (ref 3.5–5.0)
BILIRUBIN TOTAL: 1 mg/dL (ref 0.3–1.2)
BUN: 29 mg/dL — ABNORMAL HIGH (ref 6–20)
CALCIUM: 7.3 mg/dL — AB (ref 8.9–10.3)
CO2: 24 mmol/L (ref 22–32)
Chloride: 105 mmol/L (ref 101–111)
Creatinine, Ser: 1.74 mg/dL — ABNORMAL HIGH (ref 0.44–1.00)
GFR calc Af Amer: 32 mL/min — ABNORMAL LOW (ref >60)
GFR, EST NON AFRICAN AMERICAN: 27 mL/min — AB (ref >60)
GLUCOSE: 167 mg/dL — AB (ref 65–99)
POTASSIUM: 5.1 mmol/L (ref 3.5–5.1)
Sodium: 135 mmol/L (ref 135–145)
TOTAL PROTEIN: 5.5 g/dL — AB (ref 6.5–8.1)

## 2015-08-02 LAB — CBC
HEMATOCRIT: 34 % — AB (ref 35.0–47.0)
HEMOGLOBIN: 11.3 g/dL — AB (ref 12.0–16.0)
MCH: 29.5 pg (ref 26.0–34.0)
MCHC: 33.3 g/dL (ref 32.0–36.0)
MCV: 88.6 fL (ref 80.0–100.0)
Platelets: 103 10*3/uL — ABNORMAL LOW (ref 150–440)
RBC: 3.84 MIL/uL (ref 3.80–5.20)
RDW: 13.8 % (ref 11.5–14.5)
WBC: 5.1 10*3/uL (ref 3.6–11.0)

## 2015-08-02 LAB — URINALYSIS COMPLETE WITH MICROSCOPIC (ARMC ONLY)
Bilirubin Urine: NEGATIVE
GLUCOSE, UA: NEGATIVE mg/dL
Hgb urine dipstick: NEGATIVE
NITRITE: NEGATIVE
RBC / HPF: NONE SEEN RBC/hpf (ref 0–5)
Specific Gravity, Urine: 1.017 (ref 1.005–1.030)
pH: 7 (ref 5.0–8.0)

## 2015-08-02 LAB — RAPID INFLUENZA A&B ANTIGENS (ARMC ONLY)
INFLUENZA A (ARMC): NOT DETECTED
INFLUENZA B (ARMC): NOT DETECTED

## 2015-08-02 LAB — LACTIC ACID, PLASMA: LACTIC ACID, VENOUS: 1.4 mmol/L (ref 0.5–2.0)

## 2015-08-02 MED ORDER — ACETAMINOPHEN 325 MG PO TABS
650.0000 mg | ORAL_TABLET | Freq: Once | ORAL | Status: AC
  Administered 2015-08-02: 650 mg via ORAL
  Filled 2015-08-02: qty 2

## 2015-08-02 NOTE — ED Notes (Addendum)
Pt EMS from home with hx of Alzheimers but altered from baseline per family x2days, also states weakness and n/v today. Pt alert to person and place.  Pt has strong urine odor

## 2015-08-02 NOTE — ED Provider Notes (Signed)
[facility] Emergency Department Provider Note  ____________________________________________   I have reviewed the triage vital signs and the nursing notes.   HISTORY  Chief Complaint Altered Mental Status and Weakness    HPI Shannon Todd is a 75 y.o. female with a history of Alzheimer's disease presents today with slightly increased confusion over the last few days. Also some vomiting. Very limited history from the patient. She states she feels "all right". Patient has a fever upon arrival. According to family, she is slightly decreased in her mental status. There has been a slight cough as well. Further history from the patient is unavailable given her mental status.  Past Medical History  Diagnosis Date  . Diabetes mellitus without complication (HCC)   . Thyroid disease   . Stroke (HCC)   . Dementia     There are no active problems to display for this patient.   Past Surgical History  Procedure Laterality Date  . Arterial bypass surgry    . Abdominal hysterectomy      Current Outpatient Rx  Name  Route  Sig  Dispense  Refill  . allopurinol (ZYLOPRIM) 300 MG tablet   Oral   Take 300 mg by mouth daily.         Marland Kitchen amLODipine (NORVASC) 10 MG tablet   Oral   Take 10 mg by mouth daily.         Marland Kitchen aspirin 81 MG tablet   Oral   Take 81 mg by mouth daily.         . Cholecalciferol (VITAMIN D3) 1000 UNITS CAPS   Oral   Take by mouth.         . citalopram (CELEXA) 10 MG tablet   Oral   Take 10 mg by mouth daily.         . clopidogrel (PLAVIX) 75 MG tablet   Oral   Take 75 mg by mouth daily.         Marland Kitchen dicyclomine (BENTYL) 20 MG tablet   Oral   Take 20 mg by mouth 3 (three) times daily before meals.         . donepezil (ARICEPT) 23 MG TABS tablet   Oral   Take 23 mg by mouth at bedtime.         Marland Kitchen glipiZIDE (GLUCOTROL XL) 2.5 MG 24 hr tablet   Oral   Take 2.5 mg by mouth daily with breakfast.         . haloperidol  (HALDOL) 0.5 MG tablet   Oral   Take 0.5 mg by mouth 2 (two) times daily. 1-2 tablets by mouth every evening for confusion         . levothyroxine (SYNTHROID, LEVOTHROID) 112 MCG tablet   Oral   Take 112 mcg by mouth daily before breakfast.         . memantine (NAMENDA XR) 7 MG CP24 24 hr capsule   Oral   Take 7 mg by mouth daily.         Marland Kitchen omeprazole (PRILOSEC) 20 MG capsule   Oral   Take 20 mg by mouth daily.         Marland Kitchen oxybutynin (DITROPAN) 5 MG tablet   Oral   Take 5 mg by mouth 2 (two) times daily.         . promethazine (PHENERGAN) 12.5 MG tablet   Oral   Take 12.5 mg by mouth every 6 (six) hours as needed for nausea  or vomiting.         . simvastatin (ZOCOR) 40 MG tablet   Oral   Take 40 mg by mouth daily.         . traMADol (ULTRAM) 50 MG tablet   Oral   Take 1 tablet (50 mg total) by mouth every 6 (six) hours as needed.   20 tablet   0     Allergies Sulfa antibiotics  No family history on file.  Social History Social History  Substance Use Topics  . Smoking status: Never Smoker   . Smokeless tobacco: Never Used  . Alcohol Use: No    Review of Systems Cannot fully obtain second patient baseline dementia  ____________________________________________   PHYSICAL EXAM:  VITAL SIGNS: ED Triage Vitals  Enc Vitals Group     BP 08/02/15 2207 147/66 mmHg     Pulse Rate 08/02/15 2207 73     Resp 08/02/15 2207 16     Temp 08/02/15 2212 101.4 F (38.6 C)     Temp Source 08/02/15 2212 Oral     SpO2 08/02/15 2207 90 %     Weight --      Height --      Head Cir --      Peak Flow --      Pain Score 08/02/15 2208 0     Pain Loc --      Pain Edu? --      Excl. in GC? --     Constitutional: Alert and oriented to name and place Well appearing and in no acute distress. Eyes: Conjunctivae are normal. PERRL. EOMI. Head: Atraumatic. Nose: No congestion/rhinnorhea. Mouth/Throat: Mucous membranes are moist.  Oropharynx  non-erythematous. Neck: No stridor.   Nontender with no meningismus Cardiovascular: Normal rate, regular rhythm. Grossly normal heart sounds.  Good peripheral circulation. Respiratory: Normal respiratory effort.  No retractions. Lungs CTAB. Abdominal: Soft and nontender. No distention. No guarding no rebound Back:  There is no focal tenderness or step off there is no midline tenderness there are no lesions noted. there is no CVA tenderness Musculoskeletal: No lower extremity tenderness. No joint effusions, no DVT signs strong distal pulses mild edema Neurologic:  Normal speech and language. No gross focal neurologic deficits are appreciated.  Skin:  Skin is warm, dry and intact. No rash noted. Psychiatric: Mood and affect are normal. Speech and behavior are normal.  ____________________________________________   LABS (all labs ordered are listed, but only abnormal results are displayed)  Labs Reviewed  CULTURE, BLOOD (ROUTINE X 2)  CULTURE, BLOOD (ROUTINE X 2)  CULTURE, BLOOD (ROUTINE X 2)  CULTURE, BLOOD (ROUTINE X 2)  URINE CULTURE  RAPID INFLUENZA A&B ANTIGENS ([facility] ONLY)  COMPREHENSIVE METABOLIC PANEL  CBC  LACTIC ACID, PLASMA  LACTIC ACID, PLASMA  URINALYSIS COMPLETEWITH MICROSCOPIC ([facility] ONLY)   ____________________________________________  EKG  I personally interpreted any EKGs ordered by me or triage  ____________________________________________  RADIOLOGY  I reviewed any imaging ordered by me or triage that were performed during my shift ____________________________________________   PROCEDURES  Procedure(s) performed: None  Critical Care performed: None  ____________________________________________   INITIAL IMPRESSION / ASSESSMENT AND PLAN / ED COURSE  Pertinent labs & imaging results that were available during my care of the patient were reviewed by me and considered in my medical decision making (see chart for details).  Patient presents today  with a fever and increased confusion over her baseline. We will check urinalysis, chest x-ray influenza A basic blood  work and blood cultures urine culture and reassess. ____________________________________________   FINAL CLINICAL IMPRESSION(S) / ED DIAGNOSES  Final diagnoses:  None      This chart was dictated using voice recognition software.  Despite best efforts to proofread,  errors can occur which can change meaning.     Jeanmarie Plant, MD 08/02/15 2239

## 2015-08-02 NOTE — ED Notes (Signed)
MD at bedside. 

## 2015-08-03 ENCOUNTER — Encounter: Payer: Self-pay | Admitting: Internal Medicine

## 2015-08-03 DIAGNOSIS — J9601 Acute respiratory failure with hypoxia: Secondary | ICD-10-CM | POA: Diagnosis present

## 2015-08-03 DIAGNOSIS — W01198A Fall on same level from slipping, tripping and stumbling with subsequent striking against other object, initial encounter: Secondary | ICD-10-CM | POA: Diagnosis not present

## 2015-08-03 DIAGNOSIS — Y998 Other external cause status: Secondary | ICD-10-CM | POA: Diagnosis not present

## 2015-08-03 DIAGNOSIS — Y92128 Other place in nursing home as the place of occurrence of the external cause: Secondary | ICD-10-CM | POA: Diagnosis not present

## 2015-08-03 DIAGNOSIS — S0081XA Abrasion of other part of head, initial encounter: Secondary | ICD-10-CM | POA: Diagnosis not present

## 2015-08-03 DIAGNOSIS — I5023 Acute on chronic systolic (congestive) heart failure: Secondary | ICD-10-CM | POA: Diagnosis present

## 2015-08-03 DIAGNOSIS — F0280 Dementia in other diseases classified elsewhere without behavioral disturbance: Secondary | ICD-10-CM | POA: Diagnosis present

## 2015-08-03 DIAGNOSIS — Y9389 Activity, other specified: Secondary | ICD-10-CM | POA: Diagnosis not present

## 2015-08-03 DIAGNOSIS — Z90710 Acquired absence of both cervix and uterus: Secondary | ICD-10-CM | POA: Diagnosis not present

## 2015-08-03 DIAGNOSIS — N184 Chronic kidney disease, stage 4 (severe): Secondary | ICD-10-CM | POA: Diagnosis present

## 2015-08-03 DIAGNOSIS — S7002XA Contusion of left hip, initial encounter: Secondary | ICD-10-CM | POA: Diagnosis not present

## 2015-08-03 DIAGNOSIS — N390 Urinary tract infection, site not specified: Secondary | ICD-10-CM | POA: Diagnosis present

## 2015-08-03 DIAGNOSIS — N3281 Overactive bladder: Secondary | ICD-10-CM | POA: Diagnosis present

## 2015-08-03 DIAGNOSIS — N179 Acute kidney failure, unspecified: Secondary | ICD-10-CM | POA: Diagnosis present

## 2015-08-03 DIAGNOSIS — Z792 Long term (current) use of antibiotics: Secondary | ICD-10-CM | POA: Diagnosis not present

## 2015-08-03 DIAGNOSIS — Z8673 Personal history of transient ischemic attack (TIA), and cerebral infarction without residual deficits: Secondary | ICD-10-CM | POA: Diagnosis not present

## 2015-08-03 DIAGNOSIS — Z7902 Long term (current) use of antithrombotics/antiplatelets: Secondary | ICD-10-CM | POA: Diagnosis not present

## 2015-08-03 DIAGNOSIS — E875 Hyperkalemia: Secondary | ICD-10-CM | POA: Diagnosis present

## 2015-08-03 DIAGNOSIS — J189 Pneumonia, unspecified organism: Secondary | ICD-10-CM | POA: Diagnosis present

## 2015-08-03 DIAGNOSIS — E1122 Type 2 diabetes mellitus with diabetic chronic kidney disease: Secondary | ICD-10-CM | POA: Diagnosis present

## 2015-08-03 DIAGNOSIS — G309 Alzheimer's disease, unspecified: Secondary | ICD-10-CM | POA: Diagnosis present

## 2015-08-03 DIAGNOSIS — Z882 Allergy status to sulfonamides status: Secondary | ICD-10-CM | POA: Diagnosis not present

## 2015-08-03 DIAGNOSIS — Z79899 Other long term (current) drug therapy: Secondary | ICD-10-CM | POA: Diagnosis not present

## 2015-08-03 DIAGNOSIS — S0003XA Contusion of scalp, initial encounter: Secondary | ICD-10-CM | POA: Diagnosis not present

## 2015-08-03 DIAGNOSIS — M109 Gout, unspecified: Secondary | ICD-10-CM | POA: Diagnosis present

## 2015-08-03 DIAGNOSIS — E785 Hyperlipidemia, unspecified: Secondary | ICD-10-CM | POA: Diagnosis present

## 2015-08-03 DIAGNOSIS — Z7984 Long term (current) use of oral hypoglycemic drugs: Secondary | ICD-10-CM | POA: Diagnosis not present

## 2015-08-03 DIAGNOSIS — Z7982 Long term (current) use of aspirin: Secondary | ICD-10-CM | POA: Diagnosis not present

## 2015-08-03 DIAGNOSIS — S0990XA Unspecified injury of head, initial encounter: Secondary | ICD-10-CM | POA: Diagnosis present

## 2015-08-03 DIAGNOSIS — Z20828 Contact with and (suspected) exposure to other viral communicable diseases: Secondary | ICD-10-CM | POA: Diagnosis present

## 2015-08-03 DIAGNOSIS — E039 Hypothyroidism, unspecified: Secondary | ICD-10-CM | POA: Diagnosis present

## 2015-08-03 DIAGNOSIS — R4182 Altered mental status, unspecified: Secondary | ICD-10-CM | POA: Diagnosis present

## 2015-08-03 DIAGNOSIS — E119 Type 2 diabetes mellitus without complications: Secondary | ICD-10-CM | POA: Diagnosis not present

## 2015-08-03 DIAGNOSIS — G9341 Metabolic encephalopathy: Secondary | ICD-10-CM | POA: Diagnosis present

## 2015-08-03 DIAGNOSIS — I1310 Hypertensive heart and chronic kidney disease without heart failure, with stage 1 through stage 4 chronic kidney disease, or unspecified chronic kidney disease: Secondary | ICD-10-CM | POA: Diagnosis present

## 2015-08-03 DIAGNOSIS — A86 Unspecified viral encephalitis: Secondary | ICD-10-CM | POA: Diagnosis present

## 2015-08-03 LAB — BASIC METABOLIC PANEL
ANION GAP: 9 (ref 5–15)
BUN: 33 mg/dL — ABNORMAL HIGH (ref 6–20)
CHLORIDE: 101 mmol/L (ref 101–111)
CO2: 27 mmol/L (ref 22–32)
CREATININE: 1.91 mg/dL — AB (ref 0.44–1.00)
Calcium: 8.2 mg/dL — ABNORMAL LOW (ref 8.9–10.3)
GFR calc non Af Amer: 24 mL/min — ABNORMAL LOW (ref >60)
GFR, EST AFRICAN AMERICAN: 28 mL/min — AB (ref >60)
Glucose, Bld: 128 mg/dL — ABNORMAL HIGH (ref 65–99)
Potassium: 4.5 mmol/L (ref 3.5–5.1)
SODIUM: 137 mmol/L (ref 135–145)

## 2015-08-03 LAB — CBC
HCT: 35.5 % (ref 35.0–47.0)
HEMOGLOBIN: 11.7 g/dL — AB (ref 12.0–16.0)
MCH: 30 pg (ref 26.0–34.0)
MCHC: 32.9 g/dL (ref 32.0–36.0)
MCV: 91.2 fL (ref 80.0–100.0)
Platelets: 108 10*3/uL — ABNORMAL LOW (ref 150–440)
RBC: 3.9 MIL/uL (ref 3.80–5.20)
RDW: 14.1 % (ref 11.5–14.5)
WBC: 7.7 10*3/uL (ref 3.6–11.0)

## 2015-08-03 LAB — GLUCOSE, CAPILLARY
Glucose-Capillary: 165 mg/dL — ABNORMAL HIGH (ref 65–99)
Glucose-Capillary: 138 mg/dL — ABNORMAL HIGH (ref 65–99)

## 2015-08-03 LAB — HEMOGLOBIN A1C: Hgb A1c MFr Bld: 4.9 % (ref 4.0–6.0)

## 2015-08-03 MED ORDER — MEMANTINE HCL ER 7 MG PO CP24
7.0000 mg | ORAL_CAPSULE | Freq: Every day | ORAL | Status: DC
  Administered 2015-08-03 – 2015-08-06 (×4): 7 mg via ORAL
  Filled 2015-08-03 (×4): qty 1

## 2015-08-03 MED ORDER — ONDANSETRON HCL 4 MG PO TABS: 4.0000 mg | ORAL_TABLET | Freq: Four times a day (QID) | ORAL | Status: DC | PRN

## 2015-08-03 MED ORDER — ENOXAPARIN SODIUM 30 MG/0.3ML ~~LOC~~ SOLN
30.0000 mg | SUBCUTANEOUS | Status: DC
  Administered 2015-08-03 – 2015-08-06 (×4): 30 mg via SUBCUTANEOUS
  Filled 2015-08-03 (×4): qty 0.3

## 2015-08-03 MED ORDER — SODIUM CHLORIDE 0.9 % IV SOLN
INTRAVENOUS | Status: DC
  Administered 2015-08-03: 05:00:00 via INTRAVENOUS

## 2015-08-03 MED ORDER — DEXTROSE 5 % IV SOLN
1.0000 g | Freq: Once | INTRAVENOUS | Status: AC
  Administered 2015-08-03: 1 g via INTRAVENOUS
  Filled 2015-08-03: qty 10

## 2015-08-03 MED ORDER — ACETAMINOPHEN 325 MG PO TABS
650.0000 mg | ORAL_TABLET | Freq: Four times a day (QID) | ORAL | Status: DC | PRN
  Administered 2015-08-03 – 2015-08-04 (×2): 650 mg via ORAL
  Filled 2015-08-03 (×2): qty 2

## 2015-08-03 MED ORDER — IBUPROFEN 600 MG PO TABS
600.0000 mg | ORAL_TABLET | Freq: Once | ORAL | Status: AC
  Administered 2015-08-03: 11:00:00 600 mg via ORAL
  Filled 2015-08-03: qty 1

## 2015-08-03 MED ORDER — ONDANSETRON HCL 4 MG/2ML IJ SOLN
4.0000 mg | Freq: Four times a day (QID) | INTRAMUSCULAR | Status: DC | PRN
  Filled 2015-08-03: qty 2

## 2015-08-03 MED ORDER — SIMVASTATIN 40 MG PO TABS
40.0000 mg | ORAL_TABLET | Freq: Every day | ORAL | Status: DC
  Administered 2015-08-03 – 2015-08-06 (×4): 40 mg via ORAL
  Filled 2015-08-03 (×4): qty 1

## 2015-08-03 MED ORDER — OXYBUTYNIN CHLORIDE 5 MG PO TABS
5.0000 mg | ORAL_TABLET | Freq: Two times a day (BID) | ORAL | Status: DC
  Administered 2015-08-03 – 2015-08-06 (×7): 5 mg via ORAL
  Filled 2015-08-03 (×7): qty 1

## 2015-08-03 MED ORDER — CLOPIDOGREL BISULFATE 75 MG PO TABS
75.0000 mg | ORAL_TABLET | Freq: Every day | ORAL | Status: DC
  Administered 2015-08-03 – 2015-08-06 (×4): 75 mg via ORAL
  Filled 2015-08-03 (×4): qty 1

## 2015-08-03 MED ORDER — PANTOPRAZOLE SODIUM 40 MG PO TBEC
40.0000 mg | DELAYED_RELEASE_TABLET | Freq: Every day | ORAL | Status: DC
  Administered 2015-08-03 – 2015-08-06 (×4): 40 mg via ORAL
  Filled 2015-08-03 (×4): qty 1

## 2015-08-03 MED ORDER — ASPIRIN EC 81 MG PO TBEC
81.0000 mg | DELAYED_RELEASE_TABLET | Freq: Every day | ORAL | Status: DC
  Administered 2015-08-03 – 2015-08-06 (×4): 81 mg via ORAL
  Filled 2015-08-03 (×4): qty 1

## 2015-08-03 MED ORDER — LEVOTHYROXINE SODIUM 112 MCG PO TABS
112.0000 ug | ORAL_TABLET | Freq: Every day | ORAL | Status: DC
  Administered 2015-08-03 – 2015-08-06 (×4): 112 ug via ORAL
  Filled 2015-08-03 (×4): qty 1

## 2015-08-03 MED ORDER — INSULIN ASPART 100 UNIT/ML ~~LOC~~ SOLN
0.0000 [IU] | Freq: Three times a day (TID) | SUBCUTANEOUS | Status: DC
  Administered 2015-08-03 – 2015-08-05 (×2): 2 [IU] via SUBCUTANEOUS
  Administered 2015-08-05 – 2015-08-06 (×2): 1 [IU] via SUBCUTANEOUS
  Filled 2015-08-03: qty 1
  Filled 2015-08-03: qty 2
  Filled 2015-08-03: qty 1
  Filled 2015-08-03: qty 2

## 2015-08-03 MED ORDER — ACETAMINOPHEN 650 MG RE SUPP: 650.0000 mg | Freq: Four times a day (QID) | RECTAL | Status: DC | PRN

## 2015-08-03 MED ORDER — DEXTROSE 5 % IV SOLN: 1.0000 g | INTRAVENOUS | Status: DC

## 2015-08-03 MED ORDER — IPRATROPIUM-ALBUTEROL 0.5-2.5 (3) MG/3ML IN SOLN: 3.0000 mL | RESPIRATORY_TRACT | Status: DC | PRN

## 2015-08-03 MED ORDER — OSELTAMIVIR PHOSPHATE 30 MG PO CAPS
30.0000 mg | ORAL_CAPSULE | Freq: Two times a day (BID) | ORAL | Status: DC
  Administered 2015-08-03 – 2015-08-04 (×2): 30 mg via ORAL
  Filled 2015-08-03 (×4): qty 1

## 2015-08-03 MED ORDER — AMLODIPINE BESYLATE 10 MG PO TABS
10.0000 mg | ORAL_TABLET | Freq: Every day | ORAL | Status: DC
Start: 2015-08-03 — End: 2015-08-06
  Administered 2015-08-03 – 2015-08-06 (×4): 10 mg via ORAL
  Filled 2015-08-03 (×4): qty 1

## 2015-08-03 MED ORDER — DONEPEZIL HCL 23 MG PO TABS
23.0000 mg | ORAL_TABLET | Freq: Every day | ORAL | Status: DC
  Administered 2015-08-03 – 2015-08-05 (×3): 23 mg via ORAL
  Filled 2015-08-03 (×5): qty 1

## 2015-08-03 MED ORDER — CITALOPRAM HYDROBROMIDE 20 MG PO TABS
10.0000 mg | ORAL_TABLET | Freq: Every day | ORAL | Status: DC
  Administered 2015-08-03 – 2015-08-06 (×4): 10 mg via ORAL
  Filled 2015-08-03 (×2): qty 1
  Filled 2015-08-03: qty 2
  Filled 2015-08-03: qty 1

## 2015-08-03 MED ORDER — DEXTROSE 5 % IV SOLN
1.0000 g | INTRAVENOUS | Status: DC
  Filled 2015-08-03: qty 10

## 2015-08-03 MED ORDER — INSULIN ASPART 100 UNIT/ML ~~LOC~~ SOLN: 0.0000 [IU] | Freq: Every day | SUBCUTANEOUS | Status: DC

## 2015-08-03 MED ORDER — OSELTAMIVIR PHOSPHATE 75 MG PO CAPS
75.0000 mg | ORAL_CAPSULE | Freq: Two times a day (BID) | ORAL | Status: DC
  Administered 2015-08-03: 09:00:00 75 mg via ORAL
  Filled 2015-08-03: qty 1

## 2015-08-03 NOTE — Evaluation (Signed)
Physical Therapy Evaluation Patient Details Name: Shannon Todd MRN: [id number] DOB: 25-May-1941 Today's Date: 08/03/2015   History of Present Illness  PT is here with AMS, has a history of CVA and dementia.  She is on droplet precautions despite (-) flu test secondary to other symptoms.  Clinical Impression  Pt with baseline dementia, unsure how today's status compares to normal but is is limited today due to mental status.  Pt reports that she is able to ambulate w/o AD and get out of the house regularly - unsure as to how true this is.  She is quite limited with PT today and completely unsafe with very limited ambulation from bed to recliner with unsteady, shuffling steps and inability to appropriately participate.    Follow Up Recommendations SNF (per PLOF, pt was very limited on eval)    Equipment Recommendations   (pt reports she does not use AD? would need RW)    Recommendations for Other Services       Precautions / Restrictions Precautions Precautions: Fall Restrictions Weight Bearing Restrictions: No      Mobility  Bed Mobility Overal bed mobility: Needs Assistance Bed Mobility: Supine to Sit     Supine to sit: Mod assist     General bed mobility comments: Pt needs assist to get LEs off EOB and more signficant assist to get trunk up to sitting despite much cuing and assist  Transfers Overall transfer level: Needs assistance Equipment used: Rolling walker (2 wheeled) Transfers: Sit to/from Stand Sit to Stand: Min assist         General transfer comment: Pt able to rise with very little phyiscal assist, she is unable to control sitting back down into recliner despite cues to use UEs on rail  Ambulation/Gait Ambulation/Gait assistance: Mod assist;Max assist Ambulation Distance (Feet): 4 Feet Assistive device: Rolling walker (2 wheeled)       General Gait Details: Pt with very unsteady, shuffling steps from bed to recliner with very little ability to follow  instructions or show ability to improve with cuing.   Stairs            Wheelchair Mobility    Modified Rankin (Stroke Patients Only)       Balance                                             Pertinent Vitals/Pain Pain Assessment: No/denies pain    Home Living Family/patient expects to be discharged to:: Private residence Living Arrangements: Children     Home Access:  (pt has hard time answering, appears she has a few steps?)              Prior Function           Comments: Pt reports that she does not normally use an assistive device and states she can go out to eat, shope etc though given her confusion this may not be the case.     Hand Dominance        Extremity/Trunk Assessment   Upper Extremity Assessment: Generalized weakness;Overall [facility] for tasks assessed           Lower Extremity Assessment: Overall WFL for tasks assessed;Generalized weakness         Communication   Communication:  (Pt has difficulty answering some questions appropriately )  Cognition Arousal/Alertness:  (knows it's February and in  a hospital, can't answer further) Behavior During Therapy: Flat affect Overall Cognitive Status: History of cognitive impairments - at baseline (unsure how she compares to baseline)                      General Comments      Exercises        Assessment/Plan    PT Assessment Patient needs continued PT services  PT Diagnosis Generalized weakness;Difficulty walking   PT Problem List Decreased strength;Decreased range of motion;Decreased activity tolerance;Decreased balance;Decreased mobility;Decreased knowledge of use of DME;Decreased safety awareness;Decreased cognition  PT Treatment Interventions Gait training;DME instruction;Functional mobility training;Therapeutic activities;Therapeutic exercise;Balance training;Neuromuscular re-education   PT Goals (Current goals can be found in the Care Plan section)  Acute Rehab PT Goals Patient Stated Goal: pt unable to state PT Goal Formulation: Patient unable to participate in goal setting Time For Goal Achievement: 08/17/15 Potential to Achieve Goals: Good    Frequency Min 2X/week   Barriers to discharge        Co-evaluation               End of Session Equipment Utilized During Treatment: Gait belt Activity Tolerance:  (limited due to mental status) Patient left: with chair alarm set;with call bell/phone within reach           Time: 1610-9604 PT Time Calculation (min) (ACUTE ONLY): 24 min   Charges:   PT Evaluation $PT Eval Moderate Complexity: 1 Procedure     PT G Codes:       Loran Senters, PT, DPT [phone]  Malachi Pro 08/03/2015, 3:51 PM

## 2015-08-03 NOTE — ED Notes (Signed)
PT's daughter, Sterling Big would like to be called for any changes in pt status [phone].

## 2015-08-03 NOTE — Progress Notes (Signed)
MEDICATION RELATED CONSULT NOTE - INITIAL   Renal dosing per policy   Allergies  Allergen Reactions  . Sulfa Antibiotics Rash    Patient Measurements: Height:  (170.2 cm) Weight: 175 lb 11.2 oz (79.697 kg) IBW/kg (Calculated) : 61.6  Vital Signs: Temp: 100.1 F (37.8 C) (02/17 1355) Temp Source: Oral (02/17 1355) BP: 118/56 mmHg (02/17 1355) Pulse Rate: 59 (02/17 1355) Intake/Output from previous day:   Intake/Output from this shift:    Labs:  Recent Labs  08/02/15 2213 08/03/15 0551  WBC 5.1 7.7  HGB 11.3* 11.7*  HCT 34.0* 35.5  PLT 103* 108*  CREATININE 1.74* 1.91*  ALBUMIN 2.6*  --   PROT 5.5*  --   AST 24  --   ALT 8*  --   ALKPHOS 57  --   BILITOT 1.0  --    Estimated Creatinine Clearance: 27.2 mL/min (by C-G formula based on Cr of 1.91).   Microbiology: Recent Results (from the past 720 hour(s))  Urine culture     Status: None (Preliminary result)   Collection Time: 08/02/15 10:34 PM  Result Value Ref Range Status   Specimen Description URINE, RANDOM  Final   Special Requests NONE  Final   Culture NO GROWTH < 12 HOURS  Final   Report Status PENDING  Incomplete  Rapid Influenza A&B Antigens ([facility] only)     Status: None   Collection Time: 08/02/15 10:34 PM  Result Value Ref Range Status   Influenza A [facility]) NOT DETECTED  Final   Influenza B [facility]) NOT DETECTED  Final    Medical History: Past Medical History  Diagnosis Date  . Diabetes mellitus without complication (HCC)   . Thyroid disease   . Stroke (HCC)   . Dementia     Medications:  Tamiflu 75  Mg po BID  Assessment: Patient is a 75 yo female with orders for Tamiflu 75 mg po BID.    Est CrCl~27 mL/min    Plan:  Will renally adjust Tamiflu to 30 mg po BID for CrCl~30 mL/min.  Continue to follow per policy.   Zhoe Catania G 08/03/2015,3:36 PM

## 2015-08-03 NOTE — ED Notes (Signed)
Pt repositioned in bed and [employer] replaced on pt. It had fallen off. Pt resting at this time.

## 2015-08-03 NOTE — Clinical Social Work Placement (Signed)
   CLINICAL SOCIAL WORK PLACEMENT  NOTE  Date:  08/03/2015  Patient Details  Name: Shannon Todd MRN: [id number] Date of Birth: 1941-05-07  Clinical Social Work is seeking post-discharge placement for this patient at the Skilled  Nursing Facility level of care (*CSW will initial, date and re-position this form in  chart as items are completed):  Yes   Patient/family provided with [facility] Clinical Social Work Department's list of facilities offering this level of care within the geographic area requested by the patient (or if unable, by the patient's family).  Yes   Patient/family informed of their freedom to choose among providers that offer the needed level of care, that participate in Medicare, Medicaid or managed care program needed by the patient, have an available bed and are willing to accept the patient.  Yes   Patient/family informed of [facility]'s ownership interest in [facility] and [facility], as well as of the fact that they are under no obligation to receive care at these facilities.  PASRR submitted to EDS on       PASRR number received on       Existing PASRR number confirmed on 08/03/15     FL2 transmitted to all facilities in geographic area requested by pt/family on 08/03/15     FL2 transmitted to all facilities within larger geographic area on       Patient informed that his/her managed care company has contracts with or will negotiate with certain facilities, including the following:            Patient/family informed of bed offers received.  Patient chooses bed at       Physician recommends and patient chooses bed at      Patient to be transferred to   on  .  Patient to be transferred to facility by       Patient family notified on   of transfer.  Name of family member notified:        PHYSICIAN       Additional Comment:    _______________________________________________ Dede Query, LCSW 08/03/2015, 5:17 PM

## 2015-08-03 NOTE — H&P (Signed)
[facility] - [place] [facility]   PATIENT NAME: Shannon Todd    MR#:  [id number]  DATE OF BIRTH:  1940/09/24  DATE OF ADMISSION:  08/02/2015  PRIMARY CARE PHYSICIAN: Delton Prairie, MD   REQUESTING/REFERRING PHYSICIAN:   CHIEF COMPLAINT:   Chief Complaint  Patient presents with  . Altered Mental Status  . Weakness    HISTORY OF PRESENT ILLNESS: Shannon Todd  is a 75 y.o. female with a known history of diabetes mellitus, dementia, CVA presented to the emergency room with confusion and disorientation. Patient was brought by the family. She appeared altered. Patient to urine showed infection and was given IV antibiotics. Patient is oriented to self and sometimes Place not completely oriented to time. Not able to give complete history. Family member was not available at bedside. Most of the information obtained from the ER attending and ER chart. She received IV Rocephin antibiotic and was started on IV fluids.  PAST MEDICAL HISTORY:   Past Medical History  Diagnosis Date  . Diabetes mellitus without complication (HCC)   . Thyroid disease   . Stroke (HCC)   . Dementia     PAST SURGICAL HISTORY: Past Surgical History  Procedure Laterality Date  . Arterial bypass surgry    . Abdominal hysterectomy      SOCIAL HISTORY:  Social History  Substance Use Topics  . Smoking status: Never Smoker   . Smokeless tobacco: Never Used  . Alcohol Use: No    FAMILY HISTORY: No family history on file.  DRUG ALLERGIES:  Allergies  Allergen Reactions  . Sulfa Antibiotics Rash    REVIEW OF SYSTEMS:  Could not be obtained secondary to confusion. MEDICATIONS AT HOME:  Prior to Admission medications   Medication Sig Start Date End Date Taking? Authorizing Provider  allopurinol (ZYLOPRIM) 300 MG tablet Take 300 mg by mouth daily.   Yes Historical Provider, MD  amLODipine (NORVASC) 10 MG tablet Take 10 mg by mouth daily.   Yes Historical Provider, MD  aspirin 81 MG  tablet Take 81 mg by mouth daily.   Yes Historical Provider, MD  Cholecalciferol (VITAMIN D3) 1000 UNITS CAPS Take 1 capsule by mouth daily.    Yes Historical Provider, MD  citalopram (CELEXA) 10 MG tablet Take 10 mg by mouth daily.   Yes Historical Provider, MD  clopidogrel (PLAVIX) 75 MG tablet Take 75 mg by mouth daily.   Yes Historical Provider, MD  dicyclomine (BENTYL) 20 MG tablet Take 20 mg by mouth 3 (three) times daily before meals.   Yes Historical Provider, MD  donepezil (ARICEPT) 23 MG TABS tablet Take 23 mg by mouth at bedtime.   Yes Historical Provider, MD  glipiZIDE (GLUCOTROL XL) 2.5 MG 24 hr tablet Take 2.5 mg by mouth daily with breakfast.   Yes Historical Provider, MD  haloperidol (HALDOL) 0.5 MG tablet Take 0.5 mg by mouth 2 (two) times daily. 1-2 tablets by mouth every evening for confusion   Yes Historical Provider, MD  levothyroxine (SYNTHROID, LEVOTHROID) 112 MCG tablet Take 112 mcg by mouth daily before breakfast.   Yes Historical Provider, MD  memantine (NAMENDA XR) 7 MG CP24 24 hr capsule Take 7 mg by mouth daily.   Yes Historical Provider, MD  omeprazole (PRILOSEC) 20 MG capsule Take 20 mg by mouth daily.   Yes Historical Provider, MD  oxybutynin (DITROPAN) 5 MG tablet Take 5 mg by mouth 2 (two) times daily.   Yes Historical Provider, MD  promethazine (  PHENERGAN) 12.5 MG tablet Take 12.5 mg by mouth every 6 (six) hours as needed for nausea or vomiting.   Yes Historical Provider, MD  simvastatin (ZOCOR) 40 MG tablet Take 40 mg by mouth daily.   Yes Historical Provider, MD  traMADol (ULTRAM) 50 MG tablet Take 1 tablet (50 mg total) by mouth every 6 (six) hours as needed. 05/26/15  Yes Jennye Moccasin, MD      PHYSICAL EXAMINATION:   VITAL SIGNS: Blood pressure 145/70, pulse 64, temperature 100.1 F (37.8 C), temperature source Oral, resp. rate 16, SpO2 96 %.  GENERAL:  75 y.o.-year-old patient lying in the bed disoriented.  EYES: Pupils equal, round, reactive to  light and accommodation. No scleral icterus. Extraocular muscles intact.  HEENT: Head atraumatic, normocephalic. Oropharynx and nasopharynx clear.  NECK:  Supple, no jugular venous distention. No thyroid enlargement, no tenderness.  LUNGS: Normal breath sounds bilaterally, no wheezing, rales,rhonchi or crepitation. No use of accessory muscles of respiration.  CARDIOVASCULAR: S1, S2 normal. No murmurs, rubs, or gallops.  ABDOMEN: Soft, nontender, nondistended. Bowel sounds present. No organomegaly or mass.  EXTREMITIES: No pedal edema, cyanosis, or clubbing.  NEUROLOGIC: Confused,moves all extremities. PSYCHIATRIC: could not be assessed. SKIN: No obvious rash, lesion, or ulcer.   LABORATORY PANEL:   CBC  Recent Labs Lab 08/02/15 2213  WBC 5.1  HGB 11.3*  HCT 34.0*  PLT 103*  MCV 88.6  MCH 29.5  MCHC 33.3  RDW 13.8   ------------------------------------------------------------------------------------------------------------------  Chemistries   Recent Labs Lab 08/02/15 2213  NA 135  K 5.1  CL 105  CO2 24  GLUCOSE 167*  BUN 29*  CREATININE 1.74*  CALCIUM 7.3*  AST 24  ALT 8*  ALKPHOS 57  BILITOT 1.0   ------------------------------------------------------------------------------------------------------------------ CrCl cannot be calculated (Unknown ideal weight.). ------------------------------------------------------------------------------------------------------------------ No results for input(s): TSH, T4TOTAL, T3FREE, THYROIDAB in the last 72 hours.  Invalid input(s): FREET3   Coagulation profile No results for input(s): INR, PROTIME in the last 168 hours. ------------------------------------------------------------------------------------------------------------------- No results for input(s): DDIMER in the last 72 hours. -------------------------------------------------------------------------------------------------------------------  Cardiac  Enzymes No results for input(s): CKMB, TROPONINI, MYOGLOBIN in the last 168 hours.  Invalid input(s): CK ------------------------------------------------------------------------------------------------------------------ Invalid input(s): POCBNP  ---------------------------------------------------------------------------------------------------------------  Urinalysis    Component Value Date/Time   COLORURINE AMBER* 08/02/2015 2234   COLORURINE YELLOW 01/21/2012 1449   APPEARANCEUR CLOUDY* 08/02/2015 2234   APPEARANCEUR CLOUDY 01/21/2012 1449   LABSPEC 1.017 08/02/2015 2234   LABSPEC 1.025 01/21/2012 1449   PHURINE 7.0 08/02/2015 2234   PHURINE 6.0 01/21/2012 1449   GLUCOSEU NEGATIVE 08/02/2015 2234   GLUCOSEU NEGATIVE 01/21/2012 1449   HGBUR NEGATIVE 08/02/2015 2234   HGBUR 1+ 01/21/2012 1449   BILIRUBINUR NEGATIVE 08/02/2015 2234   BILIRUBINUR NEGATIVE 01/21/2012 1449   KETONESUR TRACE* 08/02/2015 2234   KETONESUR NEGATIVE 01/21/2012 1449   PROTEINUR >500* 08/02/2015 2234   PROTEINUR 100 mg/dL 56/21/3086 5784   NITRITE NEGATIVE 08/02/2015 2234   NITRITE POSITIVE 01/21/2012 1449   LEUKOCYTESUR TRACE* 08/02/2015 2234   LEUKOCYTESUR 3+ 01/21/2012 1449     RADIOLOGY: Dg Chest 2 View  08/02/2015  CLINICAL DATA:  75 year old female with vomiting and increased confusion, fever EXAM: CHEST  2 VIEW COMPARISON:  Prior chest x-ray 01/21/2012 FINDINGS: Cardiomegaly slightly progressed compared to prior. Patient is status post median sternotomy with evidence of prior multivessel CABG. Trace atherosclerotic calcification in the transverse aorta. Increased pulmonary vascular congestion with mild interstitial edema. No acute osseous abnormality. IMPRESSION: Mild-moderate CHF. Electronically Signed  By: Malachy Moan M.D.   On: 08/02/2015 23:28    EKG: Orders placed or performed during the hospital encounter of 05/26/15  . ED EKG  . ED EKG    IMPRESSION AND PLAN: 75 year old  female patient with history of dementia, CVA, diabetes mellitus  presented to the emergency room with confusion and lethargy. Admitting diagnosis 1. Altered mental status 2. Urinary tract infection 3. History of dementia 4. Diabetes mellitus Treatment plan IV fluid hydration IV Rocephin antibiotic 1 g daily Follow-up culture Supportive care.  All the records are reviewed and case discussed with ED provider. Management plans discussed with the patient, family and they are in agreement.  CODE STATUS: Code Status History    This patient does not have a recorded code status. Please follow your organizational policy for patients in this situation.       TOTAL TIME TAKING CARE OF THIS PATIENT: 32 minutes.    Ihor Austin M.D on 08/03/2015 at 3:34 AM  Between 7am to 6pm - Pager - [phone]  After 6pm go to [email or web address] - password [email or web address] [facility]  [facility]  Office  [phone]  CC: Primary care physician; Delton Prairie, MD

## 2015-08-03 NOTE — Progress Notes (Signed)
[facility] - [place] at [facility]   PATIENT NAME: Shannon Todd    MR#:  [id number]  DATE OF BIRTH:  [birth year]  SUBJECTIVE:  Pleasantly confused without complaints Still febrile with morning  REVIEW OF SYSTEMS:  Unable to fully obtain given patient's baseline mental status-dementia  DRUG ALLERGIES:   Allergies  Allergen Reactions  . Sulfa Antibiotics Rash    VITALS:  Blood pressure 156/74, pulse 83, temperature 100.4 F (38 C), temperature source Oral, resp. rate 18, height  (1.702 m), weight 79.697 kg (175 lb 11.2 oz), SpO2 92 %.  PHYSICAL EXAMINATION:  VITAL SIGNS: Filed Vitals:   08/03/15 1030 08/03/15 1145  BP:    Pulse:    Temp: 101.6 F (38.7 C) 100.4 F (38 C)  Resp:     GENERAL:75 y.o.female currently in no acute distress.  HEAD: Normocephalic, atraumatic.  EYES: Pupils equal, round, reactive to light. Extraocular muscles intact. No scleral icterus.  MOUTH: Moist mucosal membrane. Dentition intact. No abscess noted.  EAR, NOSE, THROAT: Clear without exudates. No external lesions.  NECK: Supple. No thyromegaly. No nodules. No JVD.  PULMONARY: Clear to ascultation, without wheeze rails or rhonci. No use of accessory muscles, Good respiratory effort. good air entry bilaterally CHEST: Nontender to palpation.  CARDIOVASCULAR: S1 and S2. Regular rate and rhythm. No murmurs, rubs, or gallops. No edema. Pedal pulses 2+ bilaterally.  GASTROINTESTINAL: Soft, nontender, nondistended. No masses. Positive bowel sounds. No hepatosplenomegaly.  MUSCULOSKELETAL: No swelling, clubbing, or edema. Range of motion full in all extremities.  NEUROLOGIC: Cranial nerves II through XII are intact. No gross focal neurological deficits. Sensation intact. Reflexes intact.  SKIN: No ulceration, lesions, rashes, or cyanosis. Skin warm and dry. Turgor intact.  PSYCHIATRIC: Mood, affect blunted. The patient is awake, alert and oriented x self. Insight, judgment  poor     LABORATORY PANEL:   CBC  Recent Labs Lab 08/03/15 0551  WBC 7.7  HGB 11.7*  HCT 35.5  PLT 108*   ------------------------------------------------------------------------------------------------------------------  Chemistries   Recent Labs Lab 08/02/15 2213 08/03/15 0551  NA 135 137  K 5.1 4.5  CL 105 101  CO2 24 27  GLUCOSE 167* 128*  BUN 29* 33*  CREATININE 1.74* 1.91*  CALCIUM 7.3* 8.2*  AST 24  --   ALT 8*  --   ALKPHOS 57  --   BILITOT 1.0  --    ------------------------------------------------------------------------------------------------------------------  Cardiac Enzymes No results for input(s): TROPONINI in the last 168 hours. ------------------------------------------------------------------------------------------------------------------  RADIOLOGY:  Dg Chest 2 View  08/02/2015  CLINICAL DATA:  75 year old female with vomiting and increased confusion, fever EXAM: CHEST  2 VIEW COMPARISON:  Prior chest x-ray 01/21/2012 FINDINGS: Cardiomegaly slightly progressed compared to prior. Patient is status post median sternotomy with evidence of prior multivessel CABG. Trace atherosclerotic calcification in the transverse aorta. Increased pulmonary vascular congestion with mild interstitial edema. No acute osseous abnormality. IMPRESSION: Mild-moderate CHF. Electronically Signed   By: Malachy Moan M.D.   On: 08/02/2015 23:28    EKG:   Orders placed or performed during the hospital encounter of 05/26/15  . ED EKG  . ED EKG    ASSESSMENT AND PLAN:   75 year old Caucasian female history of dementia admitted 08/02/15 with fever and worsening mental status.  1. Acute on chronic metabolic encephalopathy: Patient does not meet septic criteria this is likely viral etiology, spoke with daughter over the phone patient was exposed to influenza flu test currently negative. Start Tamiflu given symptoms  for prophylactics 2. Hyperkalemia: Resolved 3.  Acute kidney injury prerenal: IV fluid hydration and follow urine output and renal function 4. Type 2 diabetes non-insulin-requiring hold oral agents at insulin sliding scale 5. Hypothyroidism unspecified Synthroid, check TSH Venous thrombus embolism prophylactic: Lovenox   Disposition: Physical therapy  social work for placement  All the records are reviewed and case discussed with Care Management/Social Workerr. Management plans discussed with the patient, family and they are in agreement.  CODE STATUS: Full  TOTAL TIME TAKING CARE OF THIS PATIENT: 31 minutes.   POSSIBLE D/C IN 1-2 DAYS, DEPENDING ON CLINICAL CONDITION.   Hower,  Mardi Mainland.D on 08/03/2015 at 12:01 PM  Between 7am to 6pm - Pager - [phone]  After 6pm: House Pager: - [phone]  [facility]  Office  [phone]  CC: Primary care physician; Delton Prairie, MD

## 2015-08-03 NOTE — Clinical Social Work Note (Signed)
Clinical Social Work Assessment  Patient Details  Name: Shannon Todd MRN: [id number] Date of Birth: Jun 05, 1941  Date of referral:  08/03/15               Reason for consult:  Facility Placement                Permission sought to share information with:  Family Supports Permission granted to share information::  Yes, Verbal Permission Granted  Name::     Victorino Dike, [phone]; Darl Pikes, [phone] Regal, [phone]   Housing/Transportation Living arrangements for the past 2 months:  Single Family Home Source of Information:  Adult Children Patient Interpreter Needed:  None Criminal Activity/Legal Involvement Pertinent to Current Situation/Hospitalization:  No - Comment as needed Significant Relationships:  Adult Children Lives with:  Adult Children Do you feel safe going back to the place where you live?  Yes Need for family participation in patient care:  Yes (Comment)  Care giving concerns:  Pt would benefit from a higher level of care.   Social Worker assessment / plan:  CSW spoke with pt's daughters to address consult for New SNF. PT is recommending STR at SNF. CSW introduced herself and explained role of social work. CSW addressed care giving concerns with Victorino Dike, with whom pt lived with. Pt now lives with Jasmine December. Jennifer's 72 year old son is a Tax adviser as well. Pt's HCPOA is Okey Regal, pt's oldest daughter. Per Victorino Dike, pt does not qualify for Medicaid and is unable to pay privately. Family has been trying to place pt for quite some time. Pt is total care and wanders from time to time. CSW also spoke with Okey Regal, who is in agreement with placement.   CSW explained to both Okey Regal and Victorino Dike the process of discharging to SNF with [facility]. A prior [facility] is needed and there is no guarantee that [facility] will be obtained, however CSW will still iniaiated a bed search.   Pt's daughter, Darl Pikes, called and stated that the preference is [facility].   CSW will continue to follow and  will follow up with bed offers.   Employment status:  Retired Database administrator PT Recommendations:  Skilled Nursing Facility Information / Referral to community resources:  Skilled Nursing Facility  Patient/Family's Response to care:  Pt's daughter were appreciative of CSW support  Patient/Family's Understanding of and Emotional Response to Diagnosis, Current Treatment, and Prognosis:  Pt's daughters would like placement for pt as it would be best for pt.   Emotional Assessment Appearance:  Appears stated age Attitude/Demeanor/Rapport:  Unable to Assess Affect (typically observed):  Unable to Assess Orientation:  Fluctuating Orientation (Suspected and/or reported Sundowners) Alcohol / Substance use:  Never Used Psych involvement (Current and /or in the community):  No (Comment)  Discharge Needs  Concerns to be addressed:  Adjustment to Illness Readmission within the last 30 days:  No Current discharge risk:  Chronically ill Barriers to Discharge:  Continued Medical Work up   Dede Query, LCSW 08/03/2015, 3:57 PM

## 2015-08-03 NOTE — Progress Notes (Signed)
Update given to patients daughter Ova Freshwater - [phone]

## 2015-08-03 NOTE — ED Notes (Signed)
Admitting MD at bedside.

## 2015-08-03 NOTE — [place] FL2 (Signed)
[place] MEDICAID FL2 LEVEL OF CARE SCREENING TOOL     IDENTIFICATION  Patient Name: Shannon Todd Birthdate: 09-28-1940 Sex: female Admission Date (Current Location): 08/02/2015  [place] and [place] Number:  Chiropodist and Address:  [facility], [address], [place], [place] [address]      Provider Number: [id number]  Attending Physician Name and Address:  Wyatt Haste, MD  Relative Name and Phone Number:       Current Level of Care: Hospital Recommended Level of Care: Skilled Nursing Facility Prior Approval Number:    Date Approved/Denied:   PASRR Number: [id number]  Discharge Plan: SNF    Current Diagnoses: Patient Active Problem List   Diagnosis Date Noted  . Altered mental status 08/03/2015    Orientation RESPIRATION BLADDER Height & Weight     Self  O2 (2L) Incontinent Weight: 175 lb 11.2 oz (79.697 kg) Height:   (170.2 cm)  BEHAVIORAL SYMPTOMS/MOOD NEUROLOGICAL BOWEL NUTRITION STATUS      Incontinent Diet (Heart Healthy, Carb Modified/Thin Liquids)  AMBULATORY STATUS COMMUNICATION OF NEEDS Skin   Limited Assist Verbally Normal                       Personal Care Assistance Level of Assistance  Bathing, Feeding, Dressing Bathing Assistance: Maximum assistance Feeding assistance: Limited assistance Dressing Assistance: Maximum assistance     Functional Limitations Info  Sight, Hearing, Speech Sight Info: Adequate Hearing Info: Impaired Speech Info: Adequate    SPECIAL CARE FACTORS FREQUENCY  PT (By licensed PT)                    Contractures      Additional Factors Info  Code Status, Allergies   Code Status Info: Full Code Allergies Info: Sulfa Antibiotics           Current Medications (08/03/2015):  This is the current hospital active medication list Current Facility-Administered Medications  Medication Dose Route Frequency Provider Last Rate Last Dose  . 0.9 %  sodium  chloride infusion   Intravenous Continuous Ihor Austin, MD 75 mL/hr at 08/03/15 0516    . acetaminophen (TYLENOL) tablet 650 mg  650 mg Oral Q6H PRN Ihor Austin, MD   650 mg at 08/03/15 1324   Or  . acetaminophen (TYLENOL) suppository 650 mg  650 mg Rectal Q6H PRN Pavan Pyreddy, MD      . amLODipine (NORVASC) tablet 10 mg  10 mg Oral Daily Ihor Austin, MD   10 mg at 08/03/15 0853  . aspirin EC tablet 81 mg  81 mg Oral Daily Ihor Austin, MD   81 mg at 08/03/15 0852  . citalopram (CELEXA) tablet 10 mg  10 mg Oral Daily Ihor Austin, MD   10 mg at 08/03/15 0852  . clopidogrel (PLAVIX) tablet 75 mg  75 mg Oral Daily Ihor Austin, MD   75 mg at 08/03/15 0851  . donepezil (ARICEPT) tablet 23 mg  23 mg Oral QHS Pavan Pyreddy, MD      . enoxaparin (LOVENOX) injection 30 mg  30 mg Subcutaneous Q24H Ihor Austin, MD   30 mg at 08/03/15 0853  . insulin aspart (novoLOG) injection 0-5 Units  0-5 Units Subcutaneous QHS Wyatt Haste, MD      . insulin aspart (novoLOG) injection 0-9 Units  0-9 Units Subcutaneous TID WC Wyatt Haste, MD      . ipratropium-albuterol (DUONEB) 0.5-2.5 (3) MG/3ML nebulizer solution  3 mL  3 mL Nebulization Q4H PRN Wyatt Haste, MD      . levothyroxine (SYNTHROID, LEVOTHROID) tablet 112 mcg  112 mcg Oral QAC breakfast Ihor Austin, MD   112 mcg at 08/03/15 0851  . memantine (NAMENDA XR) 24 hr capsule 7 mg  7 mg Oral Daily Ihor Austin, MD   7 mg at 08/03/15 0851  . ondansetron (ZOFRAN) tablet 4 mg  4 mg Oral Q6H PRN Ihor Austin, MD       Or  . ondansetron (ZOFRAN) injection 4 mg  4 mg Intravenous Q6H PRN Pavan Pyreddy, MD      . oseltamivir (TAMIFLU) capsule 30 mg  30 mg Oral BID Sherry Ruffing, RPH      . oxybutynin (DITROPAN) tablet 5 mg  5 mg Oral BID Ihor Austin, MD   5 mg at 08/03/15 0851  . pantoprazole (PROTONIX) EC tablet 40 mg  40 mg Oral QAC breakfast Ihor Austin, MD   40 mg at 08/03/15 0851  . simvastatin (ZOCOR) tablet 40 mg  40 mg Oral Daily  Ihor Austin, MD   40 mg at 08/03/15 1610     Discharge Medications: Please see discharge summary for a list of discharge medications.  Relevant Imaging Results:  Relevant Lab Results:   Additional Information SSN:  [id number]   Droplet Precautions for Flu Dede Query, LCSW

## 2015-08-04 ENCOUNTER — Inpatient Hospital Stay: Payer: Medicare Other

## 2015-08-04 ENCOUNTER — Inpatient Hospital Stay
Admit: 2015-08-04 | Discharge: 2015-08-04 | Disposition: A | Discharge status: Home / Self Care | Payer: Medicare Other | Attending: Internal Medicine | Admitting: Internal Medicine

## 2015-08-04 LAB — GLUCOSE, CAPILLARY
GLUCOSE-CAPILLARY: 116 mg/dL — AB (ref 65–99)
Glucose-Capillary: 83 mg/dL (ref 65–99)
Glucose-Capillary: 113 mg/dL — ABNORMAL HIGH (ref 65–99)
Glucose-Capillary: 111 mg/dL — ABNORMAL HIGH (ref 65–99)

## 2015-08-04 LAB — BASIC METABOLIC PANEL WITH GFR
Anion gap: 5 (ref 5–15)
BUN: 42 mg/dL — ABNORMAL HIGH (ref 6–20)
CO2: 27 mmol/L (ref 22–32)
Calcium: 7.5 mg/dL — ABNORMAL LOW (ref 8.9–10.3)
Chloride: 103 mmol/L (ref 101–111)
Creatinine, Ser: 2.23 mg/dL — ABNORMAL HIGH (ref 0.44–1.00)
GFR calc Af Amer: 23 mL/min — ABNORMAL LOW
GFR calc non Af Amer: 20 mL/min — ABNORMAL LOW
Glucose, Bld: 92 mg/dL (ref 65–99)
Potassium: 4.7 mmol/L (ref 3.5–5.1)
Sodium: 135 mmol/L (ref 135–145)

## 2015-08-04 LAB — TSH: TSH: 1.437 u[IU]/mL (ref 0.350–4.500)

## 2015-08-04 LAB — BRAIN NATRIURETIC PEPTIDE: B Natriuretic Peptide: 975 pg/mL — ABNORMAL HIGH (ref 0.0–100.0)

## 2015-08-04 MED ORDER — FUROSEMIDE 10 MG/ML IJ SOLN
40.0000 mg | Freq: Once | INTRAMUSCULAR | Status: AC
  Administered 2015-08-04: 40 mg via INTRAVENOUS
  Filled 2015-08-04: qty 4

## 2015-08-04 MED ORDER — OSELTAMIVIR PHOSPHATE 30 MG PO CAPS
30.0000 mg | ORAL_CAPSULE | Freq: Every day | ORAL | Status: DC
Start: 2015-08-05 — End: 2015-08-06
  Administered 2015-08-05 – 2015-08-06 (×2): 30 mg via ORAL
  Filled 2015-08-04 (×2): qty 1

## 2015-08-04 MED ORDER — FUROSEMIDE 10 MG/ML IJ SOLN
20.0000 mg | Freq: Every day | INTRAMUSCULAR | Status: DC
  Administered 2015-08-04 – 2015-08-06 (×3): 20 mg via INTRAVENOUS
  Filled 2015-08-04 (×3): qty 2

## 2015-08-04 MED ORDER — LEVOFLOXACIN IN D5W 250 MG/50ML IV SOLN
250.0000 mg | INTRAVENOUS | Status: DC
  Administered 2015-08-05 – 2015-08-06 (×2): 250 mg via INTRAVENOUS
  Filled 2015-08-04 (×2): qty 50

## 2015-08-04 MED ORDER — LEVOFLOXACIN IN D5W 500 MG/100ML IV SOLN
500.0000 mg | Freq: Once | INTRAVENOUS | Status: AC
  Administered 2015-08-04: 13:00:00 500 mg via INTRAVENOUS
  Filled 2015-08-04: qty 100

## 2015-08-04 NOTE — Progress Notes (Signed)
[facility] - [place] at [facility]   PATIENT NAME: Shannon Todd    MR#:  [id number]  DATE OF BIRTH:  Nov 13, 1940  SUBJECTIVE:  No acute events overnight.  REVIEW OF SYSTEMS:  Unable to fully obtain given patient's baseline mental status-dementia  DRUG ALLERGIES:   Allergies  Allergen Reactions  . Sulfa Antibiotics Rash    VITALS:  Blood pressure 143/75, pulse 70, temperature 99.8 F (37.7 C), temperature source Oral, resp. rate 18, height  (1.702 m), weight 79.697 kg (175 lb 11.2 oz), SpO2 90 %.  PHYSICAL EXAMINATION:  VITAL SIGNS: Filed Vitals:   08/04/15 0732 08/04/15 1005  BP: 143/75   Pulse: 76 70  Temp: 100 F (37.8 C) 99.8 F (37.7 C)  Resp: 18    GENERAL:75 y.o.female currently in no acute distress.  HEAD: Normocephalic, atraumatic.  EYES: Pupils equal, round, reactive to light. Extraocular muscles intact. No scleral icterus.  MOUTH: Moist mucosal membrane. Dentition intact. No abscess noted.  EAR, NOSE, THROAT: Clear without exudates. No external lesions.  NECK: Supple. No thyromegaly. No nodules. No JVD.  PULMONARY: Coarse breath sounds bilaterally. No wheezing or rales. CHEST: Nontender to palpation.  CARDIOVASCULAR: S1 and S2. Regular rate and rhythm. No murmurs, rubs, or gallops. No edema. GASTROINTESTINAL: Soft, nontender, nondistended. No masses. Positive bowel sounds. No hepatosplenomegaly.  MUSCULOSKELETAL: No swelling, clubbing, or edema. Range of motion full in all extremities.  NEUROLOGIC: Cranial nerves II through XII are intact. No gross focal neurological deficits. Sensation intact. Reflexes intact.  SKIN: No ulceration, lesions, rashes, or cyanosis. Skin warm and dry. Turgor intact.  PSYCHIATRIC: Mood, affect blunted. The patient is awake, alert and oriented x self. Insight, judgment poor     LABORATORY PANEL:   CBC  Recent Labs Lab 08/03/15 0551  WBC 7.7  HGB 11.7*  HCT 35.5  PLT 108*    ------------------------------------------------------------------------------------------------------------------  Chemistries   Recent Labs Lab 08/02/15 2213  08/04/15 0750  NA 135  < > 135  K 5.1  < > 4.7  CL 105  < > 103  CO2 24  < > 27  GLUCOSE 167*  < > 92  BUN 29*  < > 42*  CREATININE 1.74*  < > 2.23*  CALCIUM 7.3*  < > 7.5*  AST 24  --   --   ALT 8*  --   --   ALKPHOS 57  --   --   BILITOT 1.0  --   --   < > = values in this interval not displayed. ------------------------------------------------------------------------------------------------------------------  Cardiac Enzymes No results for input(s): TROPONINI in the last 168 hours. ------------------------------------------------------------------------------------------------------------------  RADIOLOGY:  Dg Chest 1 View  08/04/2015  CLINICAL DATA:  75 year old female undergoing follow-up for CHF EXAM: CHEST 1 VIEW COMPARISON:  Prior chest x-ray 08/02/2015 FINDINGS: Stable cardiomegaly. 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 Persistent mild patchy airspace opacity in the right mid lung. No pleural effusion or pneumothorax. No acute osseous abnormality. IMPRESSION: 1. Resolving CHF. 2. Persistent patchy airspace opacity in the right mid lung. This could represent residual asymmetric pulmonary edema, or infiltrate if the patient has clinical signs and symptoms of pneumonia. Electronically Signed   By: Malachy Moan M.D.   On: 08/04/2015 09:31   Dg Chest 2 View  08/02/2015  CLINICAL DATA:  75 year old female with vomiting and increased confusion, fever EXAM: CHEST  2 VIEW COMPARISON:  Prior chest x-ray  01/21/2012 FINDINGS: Cardiomegaly slightly progressed compared to prior. Patient is status post median sternotomy with evidence of prior multivessel CABG. Trace atherosclerotic calcification in the transverse aorta. Increased  pulmonary vascular congestion with mild interstitial edema. No acute osseous abnormality. IMPRESSION: Mild-moderate CHF. Electronically Signed   By: Malachy Moan M.D.   On: 08/02/2015 23:28    EKG:     ASSESSMENT AND PLAN:   75 year old Caucasian female history of dementia admitted 08/02/15 with fever and worsening mental status.  1. Acute on chronic metabolic encephalopathy: Patient does not meet septic criteria this is likely viral etiology, spoke with daughter over the phone patient was exposed to influenza flu test currently negative. Started on Tamiflu given symptoms for prophylactics 2. Hyperkalemia: Resolved 3. Acute kidney injury prerenal, baseline creatinine 1.8: Creatinine at 2.23 this morning.  Hold nephrotoxic agents.  Repeat BMP in a.m.   4. Type 2 diabetes non-insulin-requiring: Continue sliding scale insulin and ADA diet 5. Hypothyroidism unspecified : Synthroid TSH pending 6. Acute hypoxic respiratory failure: Chest x-ray shows pneumonia versus pulmonary edema. Start antibiotics and Lasix. Echocardiogram for a.m.   Venous thrombus embolism prophylactic: Lovenox  Patient will be discharged to skilled nursing facility once medically stable.    CODE STATUS: Full  TOTAL TIME TAKING CARE OF THIS PATIENT: 28 minutes.   POSSIBLE D/C IN 1-2 DAYS, DEPENDING ON CLINICAL CONDITION.   Lonnette Shrode M.D on 08/04/2015 at 10:41 AM  Between 7am to 6pm - Pager - [phone]  After 6pm: House Pager: - [phone]  [facility]  Office  [phone]  CC: Primary care physician; Delton Prairie, MD

## 2015-08-04 NOTE — Progress Notes (Addendum)
Pharmacy Antibiotic Note  Shannon Todd is a 75 y.o. female admitted on 08/02/2015 with pneumonia.  Pharmacy has been consulted for levolfoxacin dosing. CrCl = 23.3 ml/min  Plan: Levofloxacin 500 mg IV X 1 to be given on 2/18 followed by levofloxacin 250 mg IV Q24H to start 2/19 @ 10:00.    Height:  (170.2 cm) Weight: 175 lb 11.2 oz (79.697 kg) IBW/kg (Calculated) : 61.6  Temp (24hrs), Avg:99.8 F (37.7 C), Min:98.6 F (37 C), Max:100.6 F (38.1 C)   Recent Labs Lab 08/02/15 2213 08/03/15 0551 08/04/15 0750  WBC 5.1 7.7  --   CREATININE 1.74* 1.91* 2.23*  LATICACIDVEN 1.4  --   --     Estimated Creatinine Clearance: 23.3 mL/min (by C-G formula based on Cr of 2.23).    Allergies  Allergen Reactions  . Sulfa Antibiotics Rash    Antimicrobials this admission: Levaquin 500 mg IV X 1 on 2/18 followed by levaquin 250 mg IV Q24H to start 2/19 @ 10:00.    Dose adjustments this admission:   Microbiology results:  BCx: Blood Cx X 2 on 2/16 -  NGTD   UCx:    Sputum:    MRSA PCR:   Thank you for allowing pharmacy to be a part of this patient's care.  Eisha Chatterjee D 08/04/2015 10:54 AM

## 2015-08-04 NOTE — Progress Notes (Signed)
MEDICATION RELATED CONSULT NOTE - INITIAL   Renal dosing per policy   Allergies  Allergen Reactions  . Sulfa Antibiotics Rash    Patient Measurements: Height:  (170.2 cm) Weight: 175 lb 11.2 oz (79.697 kg) IBW/kg (Calculated) : 61.6  Vital Signs: Temp: 99.8 F (37.7 C) (02/18 1005) Temp Source: Oral (02/18 1005) BP: 143/75 mmHg (02/18 0732) Pulse Rate: 70 (02/18 1005) Intake/Output from previous day:   Intake/Output from this shift:    Labs:  Recent Labs  08/02/15 2213 08/03/15 0551 08/04/15 0750  WBC 5.1 7.7  --   HGB 11.3* 11.7*  --   HCT 34.0* 35.5  --   PLT 103* 108*  --   CREATININE 1.74* 1.91* 2.23*  ALBUMIN 2.6*  --   --   PROT 5.5*  --   --   AST 24  --   --   ALT 8*  --   --   ALKPHOS 57  --   --   BILITOT 1.0  --   --    Estimated Creatinine Clearance: 23.3 mL/min (by C-G formula based on Cr of 2.23).   Microbiology: Recent Results (from the past 720 hour(s))  Blood culture (routine x 2)     Status: None (Preliminary result)   Collection Time: 08/02/15 10:13 PM  Result Value Ref Range Status   Specimen Description BLOOD LEFT HAND  Final   Special Requests BOTTLES DRAWN AEROBIC AND ANAEROBIC  Final   Culture NO GROWTH 2 DAYS  Final   Report Status PENDING  Incomplete  Urine culture     Status: None (Preliminary result)   Collection Time: 08/02/15 10:34 PM  Result Value Ref Range Status   Specimen Description URINE, RANDOM  Final   Special Requests NONE  Final   Culture HOLDING FOR POSSIBLE PATHOGEN  Final   Report Status PENDING  Incomplete  Rapid Influenza A&B Antigens ([facility] only)     Status: None   Collection Time: 08/02/15 10:34 PM  Result Value Ref Range Status   Influenza A ([facility]) NOT DETECTED  Final   Influenza B ([facility]) NOT DETECTED  Final  Blood culture (routine x 2)     Status: None (Preliminary result)   Collection Time: 08/02/15 10:49 PM  Result Value Ref Range Status   Specimen Description BLOOD BLOOD RIGHT FOREARM   Final   Special Requests BOTTLES DRAWN AEROBIC AND ANAEROBIC  Final   Culture NO GROWTH 2 DAYS  Final   Report Status PENDING  Incomplete    Medical History: Past Medical History  Diagnosis Date  . Diabetes mellitus without complication (HCC)   . Thyroid disease   . Stroke (HCC)   . Dementia     Medications:  Tamiflu 75  Mg po BID  Assessment: Patient is a 75 yo female with orders for Tamiflu  po BID.    Est CrCl~23.11mL/min  Plan:  Will renally adjust Tamiflu to 30 mg po daily for CrCl<30 mL/min.  Continue to follow per policy.   Stormy Card, RPh Clinical Pharmacist 08/04/2015,11:10 AM

## 2015-08-05 LAB — URINE CULTURE

## 2015-08-05 LAB — GLUCOSE, CAPILLARY
GLUCOSE-CAPILLARY: 129 mg/dL — AB (ref 65–99)
GLUCOSE-CAPILLARY: 161 mg/dL — AB (ref 65–99)
GLUCOSE-CAPILLARY: 104 mg/dL — AB (ref 65–99)
Glucose-Capillary: 97 mg/dL (ref 65–99)

## 2015-08-05 LAB — BASIC METABOLIC PANEL
ANION GAP: 7 (ref 5–15)
BUN: 47 mg/dL — ABNORMAL HIGH (ref 6–20)
CO2: 27 mmol/L (ref 22–32)
Calcium: 7.9 mg/dL — ABNORMAL LOW (ref 8.9–10.3)
Chloride: 103 mmol/L (ref 101–111)
Creatinine, Ser: 2.28 mg/dL — ABNORMAL HIGH (ref 0.44–1.00)
GFR calc Af Amer: 23 mL/min — ABNORMAL LOW (ref >60)
GFR calc non Af Amer: 20 mL/min — ABNORMAL LOW (ref >60)
GLUCOSE: 97 mg/dL (ref 65–99)
POTASSIUM: 4.2 mmol/L (ref 3.5–5.1)
Sodium: 137 mmol/L (ref 135–145)

## 2015-08-05 MED ORDER — SODIUM CHLORIDE 0.9 % IV SOLN
INTRAVENOUS | Status: DC
  Administered 2015-08-05: 10:00:00 via INTRAVENOUS

## 2015-08-05 NOTE — Progress Notes (Signed)
Clinical Social Worker (CSW) met with patient's 2 daughters Shannon Todd [phone] and Shannon Todd [phone] in the family waiting room on the first floor. CSW discussed SNF long term care and Medicaid. Daughters reported that they want patient to go to [facility]. If [employer] denies SNF stay daughters reported that patent will go to [facility] under private pay. Daughters became tearful and reported that they have been trying to place patient for a year now. CSW provided emotional support and answered all questions about SNF placement. CSW will continue to follow and assist as needed.   Blima Losh, LCSW [phone]

## 2015-08-05 NOTE — Progress Notes (Signed)
Patient weaned to room air. Amos Gaber S, RN  

## 2015-08-05 NOTE — Progress Notes (Deleted)
Per MD order, enteric isolation precautions discontinued. Latori Beggs S, RN 

## 2015-08-05 NOTE — Consult Note (Signed)
[facility]  CONSULT NOTE    Date: 08/05/2015                  Patient Name:  Shannon Todd  MRN: [id number]  DOB: Apr 14, 1941  Age / Sex: 75 y.o., female         PCP: Juanell Fairly, MD                 Service Requesting Consult: Dr. Benjie Karvonen                 Reason for Consult: Acute Renal Failure            History of Present Illness: Ms. Shannon Todd is a 75 y.o. 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 She is starting to get better. She did not eat or drink for some time while sick. She was admitted with a fever. Thought to have a urinary tract infection. Given ceftriaxone. Urine culture with aerococcus species. Blood cultures negative.   Creatinine on admission of 1.74 which seems to be patient's baseline. Creatinine up to 2.28 and nephrology consulted. Given furosemide for pulmonary edema, furosemide 21m IV daily.    Medications: Outpatient medications: Prescriptions prior to admission  Medication Sig Dispense Refill Last Dose  . allopurinol (ZYLOPRIM) 300 MG tablet Take 300 mg by mouth daily.   08/02/2015 at Unknown time  . amLODipine (NORVASC) 10 MG tablet Take 10 mg by mouth daily.   08/02/2015 at Unknown time  . aspirin 81 MG tablet Take 81 mg by mouth daily.   08/02/2015 at AM  . Cholecalciferol (VITAMIN D3) 1000 UNITS CAPS Take 1 capsule by mouth daily.    08/02/2015 at Unknown time  . citalopram (CELEXA) 10 MG tablet Take 10 mg by mouth daily.   08/02/2015 at Unknown time  . clopidogrel (PLAVIX) 75 MG tablet Take 75 mg by mouth daily.   08/02/2015 at Unknown time  . dicyclomine (BENTYL) 20 MG tablet Take 20 mg by mouth 3 (three) times daily before  meals.   08/02/2015 at Unknown time  . donepezil (ARICEPT) 23 MG TABS tablet Take 23 mg by mouth at bedtime.   08/02/2015 at Unknown time  . glipiZIDE (GLUCOTROL XL) 2.5 MG 24 hr tablet Take 2.5 mg by mouth daily with breakfast.   08/02/2015 at Unknown time  . haloperidol (HALDOL) 0.5 MG tablet Take 0.5 mg by mouth 2 (two) times daily. 1-2 tablets by mouth every evening for confusion   08/02/2015 at Unknown time  . levothyroxine (SYNTHROID, LEVOTHROID) 112 MCG tablet Take 112 mcg by mouth daily before breakfast.   08/02/2015 at Unknown time  . memantine (NAMENDA XR) 7 MG CP24 24 hr capsule Take 7 mg by mouth daily.   08/02/2015 at Unknown time  . omeprazole (PRILOSEC) 20 MG capsule Take 20 mg by mouth daily.   08/02/2015 at Unknown time  . oxybutynin (DITROPAN) 5 MG tablet Take 5 mg by mouth 2 (two) times daily.   08/02/2015 at Unknown time  . promethazine (PHENERGAN) 12.5 MG tablet Take 12.5 mg by mouth every 6 (six) hours as needed for nausea or vomiting.   PRN at PRN  . simvastatin (ZOCOR) 40 MG tablet Take 40 mg by  mouth daily.   08/02/2015 at Unknown time  . traMADol (ULTRAM) 50 MG tablet Take 1 tablet (50 mg total) by mouth every 6 (six) hours as needed. 20 tablet 0 PRN at PRN    Current medications: Current Facility-Administered Medications  Medication Dose Route Frequency Provider Last Rate Last Dose  . 0.9 %  sodium chloride infusion   Intravenous Continuous Lavonia Dana, MD      . acetaminophen (TYLENOL) tablet 650 mg  650 mg Oral Q6H PRN Saundra Shelling, MD   650 mg at 08/04/15 0734   Or  . acetaminophen (TYLENOL) suppository 650 mg  650 mg Rectal Q6H PRN Pavan Pyreddy, MD      . amLODipine (NORVASC) tablet 10 mg  10 mg Oral Daily Saundra Shelling, MD   10 mg at 08/04/15 0734  . aspirin EC tablet 81 mg  81 mg Oral Daily Saundra Shelling, MD   81 mg at 08/04/15 0734  . citalopram (CELEXA) tablet 10 mg  10 mg Oral Daily Saundra Shelling, MD   10 mg at 08/04/15 0734  . clopidogrel (PLAVIX) tablet 75 mg   75 mg Oral Daily Saundra Shelling, MD   75 mg at 08/04/15 0734  . donepezil (ARICEPT) tablet 23 mg  23 mg Oral QHS Saundra Shelling, MD   23 mg at 08/04/15 2247  . enoxaparin (LOVENOX) injection 30 mg  30 mg Subcutaneous Q24H Pavan Pyreddy, MD   30 mg at 08/04/15 0736  . furosemide (LASIX) injection 20 mg  20 mg Intravenous Daily Bettey Costa, MD   20 mg at 08/04/15 1317  . insulin aspart (novoLOG) injection 0-5 Units  0-5 Units Subcutaneous QHS Lytle Butte, MD   0 Units at 08/03/15 2152  . insulin aspart (novoLOG) injection 0-9 Units  0-9 Units Subcutaneous TID WC Lytle Butte, MD   2 Units at 08/03/15 1745  . ipratropium-albuterol (DUONEB) 0.5-2.5 (3) MG/3ML nebulizer solution 3 mL  3 mL Nebulization Q4H PRN Lytle Butte, MD      . Levofloxacin (LEVAQUIN) IVPB 250 mg  250 mg Intravenous Q24H Sital Mody, MD      . levothyroxine (SYNTHROID, LEVOTHROID) tablet 112 mcg  112 mcg Oral QAC breakfast Saundra Shelling, MD   112 mcg at 08/04/15 0734  . memantine (NAMENDA XR) 24 hr capsule 7 mg  7 mg Oral Daily Saundra Shelling, MD   7 mg at 08/04/15 0734  . ondansetron (ZOFRAN) tablet 4 mg  4 mg Oral Q6H PRN Saundra Shelling, MD       Or  . ondansetron (ZOFRAN) injection 4 mg  4 mg Intravenous Q6H PRN Pavan Pyreddy, MD      . oseltamivir (TAMIFLU) capsule 30 mg  30 mg Oral Daily Lytle Butte, MD      . oxybutynin (DITROPAN) tablet 5 mg  5 mg Oral BID Saundra Shelling, MD   5 mg at 08/04/15 2247  . pantoprazole (PROTONIX) EC tablet 40 mg  40 mg Oral QAC breakfast Saundra Shelling, MD   40 mg at 08/04/15 0734  . simvastatin (ZOCOR) tablet 40 mg  40 mg Oral Daily Saundra Shelling, MD   40 mg at 08/04/15 0734      Allergies: Allergies  Allergen Reactions  . Sulfa Antibiotics Rash      Past Medical History: Past Medical History  Diagnosis Date  . Diabetes mellitus without complication ([place])   . Thyroid disease   . Stroke ([place])   . Dementia  Past Surgical History: Past Surgical History  Procedure Laterality  Date  . Arterial bypass surgry    . Abdominal hysterectomy       Family History: No family history on file.   Social History: Social History   Social History  . Marital Status: Widowed    Spouse Name: N/A  . Number of Children: N/A  . Years of Education: N/A   Occupational History  . retired    Social History Main Topics  . Smoking status: Never Smoker   . Smokeless tobacco: Never Used  . Alcohol Use: No  . Drug Use: No  . Sexual Activity: Not on file   Other Topics Concern  . Not on file   Social History Narrative     Review of Systems: Review of Systems  Unable to perform ROS: dementia    Vital Signs: Blood pressure 154/72, pulse 62, temperature 99 F (37.2 C), temperature source Oral, resp. rate 18, height 5' 7" (1.702 m), weight 79.697 kg (175 lb 11.2 oz), SpO2 91 %.  Weight trends: Filed Weights   08/03/15 0546  Weight: 79.697 kg (175 lb 11.2 oz)    Physical Exam: General: NAD, laying in bed  Head: Normocephalic, atraumatic. Moist oral mucosal membranes  Eyes: Anicteric, PERRL  Neck: Supple, trachea midline  Lungs:  Clear to auscultation  Heart: Regular rate and rhythm  Abdomen:  Soft, nontender,   Extremities: no peripheral edema.  Neurologic: Nonfocal, moving all four extremities  Skin: No lesions        Lab results: Basic Metabolic Panel:  Recent Labs Lab 08/03/15 0551 08/04/15 0750 08/05/15 0426  NA 137 135 137  K 4.5 4.7 4.2  CL 101 103 103  CO2 _0 GLUCOSE 128* 92 97  BUN 33* 42* 47*  CREATININE 1.91* 2.23* 2.28*  CALCIUM 8.2* 7.5* 7.9*    Liver Function Tests:  Recent Labs Lab 08/02/15 2213  AST 24  ALT 8*  ALKPHOS 57  BILITOT 1.0  PROT 5.5*  ALBUMIN 2.6*   No results for input(s): LIPASE, AMYLASE in the last 168 hours. No results for input(s): AMMONIA in the last 168 hours.  CBC:  Recent Labs Lab 08/02/15 2213 08/03/15 0551  WBC 5.1 7.7  HGB 11.3* 11.7*  HCT 34.0* 35.5  MCV 88.6 91.2  PLT  103* 108*    Cardiac Enzymes: No results for input(s): CKTOTAL, CKMB, CKMBINDEX, TROPONINI in the last 168 hours.  BNP: Invalid input(s): POCBNP  CBG:  Recent Labs Lab 08/04/15 0732 08/04/15 1116 08/04/15 1606 08/04/15 2122 08/05/15 0740  GLUCAP 83 113* 111* 116* 7    Microbiology: Results for orders placed or performed during the hospital encounter of 08/02/15  Blood culture (routine x 2)     Status: None (Preliminary result)   Collection Time: 08/02/15 10:13 PM  Result Value Ref Range Status   Specimen Description BLOOD LEFT HAND  Final   Special Requests BOTTLES DRAWN AEROBIC AND ANAEROBIC 5ML  Final   Culture NO GROWTH 3 DAYS  Final   Report Status PENDING  Incomplete  Urine culture     Status: None   Collection Time: 08/02/15 10:34 PM  Result Value Ref Range Status   Specimen Description URINE, RANDOM  Final   Special Requests NONE  Final   Culture >=100,000 COLONIES/mL AEROCOCCUS URINAE  Final   Report Status 08/05/2015 FINAL  Final  Rapid Influenza A&B Antigens ([facility] only)     Status: None   Collection Time:  08/02/15 10:34 PM  Result Value Ref Range Status   Influenza A [facility]) NOT DETECTED  Final   Influenza B [facility]) NOT DETECTED  Final  Blood culture (routine x 2)     Status: None (Preliminary result)   Collection Time: 08/02/15 10:49 PM  Result Value Ref Range Status   Specimen Description BLOOD BLOOD RIGHT FOREARM  Final   Special Requests BOTTLES DRAWN AEROBIC AND ANAEROBIC 5ML  Final   Culture NO GROWTH 3 DAYS  Final   Report Status PENDING  Incomplete    Coagulation Studies: No results for input(s): LABPROT, INR in the last 72 hours.  Urinalysis:  Recent Labs  08/02/15 2234  COLORURINE AMBER*  LABSPEC 1.017  PHURINE 7.0  GLUCOSEU NEGATIVE  HGBUR NEGATIVE  BILIRUBINUR NEGATIVE  KETONESUR TRACE*  PROTEINUR >500*  NITRITE NEGATIVE  LEUKOCYTESUR TRACE*      Imaging: Dg Chest 1 View  08/04/2015  CLINICAL DATA:  75 year old female  undergoing follow-up for CHF EXAM: CHEST 1 VIEW COMPARISON:  Prior chest x-ray 08/02/2015 FINDINGS: Stable cardiomegaly. 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 Persistent mild patchy airspace opacity in the right mid lung. No pleural effusion or pneumothorax. No acute osseous abnormality. IMPRESSION: 1. Resolving CHF. 2. Persistent patchy airspace opacity in the right mid lung. This could represent residual asymmetric pulmonary edema, or infiltrate if the patient has clinical signs and symptoms of pneumonia. Electronically Signed   By: Jacqulynn Cadet M.D.   On: 08/04/2015 09:31      Assessment & Plan: Ms. Shannon Todd is a 75 y.o. white  female with gout, hypertension, hypothyroidism, dementia, diabetes mellitus type II, hyperlipidemia, overactive bladder, who was admitted to [facility] on 08/02/2015  1. Acute renal failure on chronic kidney disease stage IV: proteinuria on presentation. Baseline creatinine of 1.74, eGFR of 27 on admission. Acute kidney failure from acute illness and poor PO intake. Acute cardiorenal syndrome may be contributing.  Chronic kidney disease and proteinuria seems to be secondary to diabetes and hypertension. No IV contrast exposure.  - Agree with continuing IV furosemide for one more day. CXR reviewed.  - No acute indication for dialysis. Continue to monitor volume status, urine output and renal function.  - Does not seem to be on an ACE-I/ARB at home.   2. Hypertension: with pulmonary edema. Blood pressure at goal.  - Echocardiogram pending, continue furosemide - monitor on a daily basis.   3. Diabetes Mellitus type II with chronic kidney disease: Hemoglobin A1c of 4.9. Home regimen of glipizide.   4. Urinary Tract infection: urine culture with aerococcus urinae (sensitivites for this bacteria are not done at this hospital). Treated with ceftriaxone and now on  levofloxacin  LOS: [place], [place] 2/19/20179:56 AM

## 2015-08-05 NOTE — Progress Notes (Signed)
Clinical Social Worker (CSW) contacted patient's daughter Victorino Dike to present bed offers. Victorino Dike chose [facility] if [facility] does not have a bed. [facility] has not responded to referral. CSW started [employer] authorization today. CSW explained to daughter that [facility] could deny paying for rehab for patient if she is custodial care. CSW explained long term care options, private pay VS. Medicaid. Per daughter patient cannot pay privately for SNF. CSW gave daughter information on how to apply for Medicaid. Daughter thanked CSW for calling. CSW will continue to follow and assist as needed.   Jetta Lout, LCSW [phone]

## 2015-08-05 NOTE — Progress Notes (Signed)
[facility] -  at [facility]   PATIENT NAME: Shannon Todd    MR#:  [id number]  DATE OF BIRTH:  [birth year]  SUBJECTIVE:  No acute events overnight. Patient has been weaned off of oxygen.   REVIEW OF SYSTEMS:  Unable to fully obtain given patient's baseline mental status-dementia  DRUG ALLERGIES:   Allergies  Allergen Reactions  . Sulfa Antibiotics Rash    VITALS:  Blood pressure 154/72, pulse 62, temperature 99 F (37.2 C), temperature source Oral, resp. rate 18, height  (1.702 m), weight 79.697 kg (175 lb 11.2 oz), SpO2 91 %.  PHYSICAL EXAMINATION:  VITAL SIGNS: Filed Vitals:   08/04/15 2119 08/05/15 0459  BP: 143/65 154/72  Pulse: 64 62  Temp: 98.8 F (37.1 C) 99 F (37.2 C)  Resp: 16 18   GENERAL:75 y.o.female currently in no acute distress.  HEAD: Normocephalic, atraumatic.  EYES: Pupils equal, round, reactive to light. Extraocular muscles intact. No scleral icterus.  MOUTH: Moist mucosal membrane. Dentition intact. No abscess noted.  EAR, NOSE, THROAT: Clear without exudates. No external lesions.  NECK: Supple. No thyromegaly. No nodules. No JVD.  PULMONARY: Bilateral crackles without wheezing or rhonchi. Normal chest expansion.   CHEST: Nontender to palpation.  CARDIOVASCULAR: S1 and S2. Regular rate and rhythm. No murmurs, rubs, or gallops. No edema.  GASTROINTESTINAL: Soft, nontender, nondistended. No masses. Positive bowel sounds. No hepatosplenomegaly.  MUSCULOSKELETAL: No swelling, clubbing, or edema. Range of motion full in all extremities.  NEUROLOGIC: Cranial nerves II through XII are intact. No gross focal neurological deficits. Sensation intact. Reflexes intact.  SKIN: No ulceration, lesions, rashes, or cyanosis. Skin warm and dry. Turgor intact.  PSYCHIATRIC: Mood, affect blunted. The patient is awake, alert and oriented x self. Insight, judgment poor     LABORATORY PANEL:   CBC  Recent Labs Lab  08/03/15 0551  WBC 7.7  HGB 11.7*  HCT 35.5  PLT 108*   ------------------------------------------------------------------------------------------------------------------  Chemistries   Recent Labs Lab 08/02/15 2213  08/05/15 0426  NA 135  < > 137  K 5.1  < > 4.2  CL 105  < > 103  CO2 24  < > 27  GLUCOSE 167*  < > 97  BUN 29*  < > 47*  CREATININE 1.74*  < > 2.28*  CALCIUM 7.3*  < > 7.9*  AST 24  --   --   ALT 8*  --   --   ALKPHOS 57  --   --   BILITOT 1.0  --   --   < > = values in this interval not displayed. ------------------------------------------------------------------------------------------------------------------  Cardiac Enzymes No results for input(s): TROPONINI in the last 168 hours. ------------------------------------------------------------------------------------------------------------------  RADIOLOGY:  Dg Chest 1 View  08/04/2015  CLINICAL DATA:  76 year old female undergoing follow-up for CHF EXAM: CHEST 1 VIEW COMPARISON:  Prior chest x-ray 08/02/2015 FINDINGS: Stable cardiomegaly. 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 Persistent mild patchy airspace opacity in the right mid lung. No pleural effusion or pneumothorax. No acute osseous abnormality. IMPRESSION: 1. Resolving CHF. 2. Persistent patchy airspace opacity in the right mid lung. This could represent residual asymmetric pulmonary edema, or infiltrate if the patient has clinical signs and symptoms of pneumonia. Electronically Signed   By: Malachy Moan M.D.   On: 08/04/2015 09:31    EKG:     ASSESSMENT AND PLAN:   75 year old Caucasian female history of  dementia admitted 08/02/15 with fever and worsening mental status.  1. Acute on chronic metabolic encephalopathy: Viral in etiology Although influenza test was negative she has been exposed to influenza and therefore was started on Tamiflu. 2.  Hyperkalemia: Resolved 3. Acute kidney injury prerenal, baseline creatinine 1.8: Creatinine at 2.28 this morning.   monitor creatinine, currently on Lasix for pulmonary edema Repeat BMP in a.m.  Consult nephrology  4. Type 2 diabetes non-insulin-requiring: Continue sliding scale insulin and ADA diet 5. Hypothyroidism unspecified : Synthroid TSH normal  6. Acute hypoxic respiratory failure: Chest x-ray shows pneumonia versus pulmonary edema. Day 2/5 levaquin renally dosed and continue Lasix. Patient with crackles on lung examination this am after asking her to cough. F/U on ECHO (completed not read)     Patient will be discharged to skilled nursing facility once medically stable.    CODE STATUS: Full  TOTAL TIME TAKING CARE OF THIS PATIENT: 25 minutes.   POSSIBLE D/C IN 1-2 DAYS, DEPENDING ON CLINICAL CONDITION.   Alberta Lenhard M.D on 08/05/2015 at 8:57 AM  Between 7am to 6pm - Pager - [phone]  After 6pm: House Pager: - [phone]  [facility]  Office  [phone]  CC: Primary care physician; Delton Prairie, MD

## 2015-08-06 ENCOUNTER — Inpatient Hospital Stay: Payer: Medicare Other

## 2015-08-06 ENCOUNTER — Emergency Department
Admission: EM | Admit: 2015-08-06 | Discharge: 2015-08-07 | Disposition: A | Discharge status: Home / Self Care | Payer: Medicare Other | Attending: Emergency Medicine | Admitting: Emergency Medicine

## 2015-08-06 ENCOUNTER — Encounter: Payer: Self-pay | Admitting: Emergency Medicine

## 2015-08-06 ENCOUNTER — Emergency Department: Payer: Medicare Other

## 2015-08-06 DIAGNOSIS — S7002XA Contusion of left hip, initial encounter: Secondary | ICD-10-CM | POA: Insufficient documentation

## 2015-08-06 DIAGNOSIS — Z792 Long term (current) use of antibiotics: Secondary | ICD-10-CM | POA: Insufficient documentation

## 2015-08-06 DIAGNOSIS — S0081XA Abrasion of other part of head, initial encounter: Secondary | ICD-10-CM | POA: Insufficient documentation

## 2015-08-06 DIAGNOSIS — Z7982 Long term (current) use of aspirin: Secondary | ICD-10-CM | POA: Insufficient documentation

## 2015-08-06 DIAGNOSIS — Y998 Other external cause status: Secondary | ICD-10-CM | POA: Insufficient documentation

## 2015-08-06 DIAGNOSIS — Y9389 Activity, other specified: Secondary | ICD-10-CM | POA: Insufficient documentation

## 2015-08-06 DIAGNOSIS — W01198A Fall on same level from slipping, tripping and stumbling with subsequent striking against other object, initial encounter: Secondary | ICD-10-CM | POA: Insufficient documentation

## 2015-08-06 DIAGNOSIS — Z7984 Long term (current) use of oral hypoglycemic drugs: Secondary | ICD-10-CM | POA: Insufficient documentation

## 2015-08-06 DIAGNOSIS — S0990XA Unspecified injury of head, initial encounter: Secondary | ICD-10-CM

## 2015-08-06 DIAGNOSIS — S0003XA Contusion of scalp, initial encounter: Secondary | ICD-10-CM | POA: Diagnosis not present

## 2015-08-06 DIAGNOSIS — Y92128 Other place in nursing home as the place of occurrence of the external cause: Secondary | ICD-10-CM | POA: Insufficient documentation

## 2015-08-06 DIAGNOSIS — Z79899 Other long term (current) drug therapy: Secondary | ICD-10-CM | POA: Insufficient documentation

## 2015-08-06 DIAGNOSIS — E119 Type 2 diabetes mellitus without complications: Secondary | ICD-10-CM | POA: Insufficient documentation

## 2015-08-06 DIAGNOSIS — Z7902 Long term (current) use of antithrombotics/antiplatelets: Secondary | ICD-10-CM | POA: Insufficient documentation

## 2015-08-06 LAB — BASIC METABOLIC PANEL
ANION GAP: 9 (ref 5–15)
BUN: 44 mg/dL — ABNORMAL HIGH (ref 6–20)
CALCIUM: 8.2 mg/dL — AB (ref 8.9–10.3)
CHLORIDE: 102 mmol/L (ref 101–111)
CO2: 26 mmol/L (ref 22–32)
Creatinine, Ser: 2.05 mg/dL — ABNORMAL HIGH (ref 0.44–1.00)
GFR calc Af Amer: 26 mL/min — ABNORMAL LOW (ref >60)
GFR calc non Af Amer: 22 mL/min — ABNORMAL LOW (ref >60)
GLUCOSE: 98 mg/dL (ref 65–99)
Potassium: 4.1 mmol/L (ref 3.5–5.1)
Sodium: 137 mmol/L (ref 135–145)

## 2015-08-06 LAB — CBC
HCT: 36.7 % (ref 35.0–47.0)
HEMOGLOBIN: 12.3 g/dL (ref 12.0–16.0)
MCH: 29.6 pg (ref 26.0–34.0)
MCHC: 33.6 g/dL (ref 32.0–36.0)
MCV: 88.3 fL (ref 80.0–100.0)
Platelets: 116 10*3/uL — ABNORMAL LOW (ref 150–440)
RBC: 4.16 MIL/uL (ref 3.80–5.20)
RDW: 13.5 % (ref 11.5–14.5)
WBC: 4.3 10*3/uL (ref 3.6–11.0)

## 2015-08-06 LAB — GLUCOSE, CAPILLARY
GLUCOSE-CAPILLARY: 92 mg/dL (ref 65–99)
Glucose-Capillary: 137 mg/dL — ABNORMAL HIGH (ref 65–99)
Glucose-Capillary: 101 mg/dL — ABNORMAL HIGH (ref 65–99)

## 2015-08-06 MED ORDER — OSELTAMIVIR PHOSPHATE 30 MG PO CAPS: 30.0000 mg | ORAL_CAPSULE | Freq: Every day | ORAL | Status: AC

## 2015-08-06 MED ORDER — LEVOFLOXACIN 250 MG PO TABS: 250.0000 mg | ORAL_TABLET | Freq: Every day | ORAL | Status: AC

## 2015-08-06 MED ORDER — TRAMADOL HCL 50 MG PO TABS
50.0000 mg | ORAL_TABLET | ORAL | Status: AC
  Administered 2015-08-06: 50 mg via ORAL
  Filled 2015-08-06: qty 1

## 2015-08-06 NOTE — ED Notes (Signed)
Pt presents to ED from [facility] with an unwitnessed fall. Per EMS, pt was found my personnel on the floor. Pt presents with right upper frontal  hematoma.

## 2015-08-06 NOTE — Discharge Instructions (Signed)
Heart Failure  Heart failure means your heart has trouble pumping blood. This makes it hard for your body to work well. Heart failure is usually a long-term (chronic) condition. You must take good care of yourself and follow your doctor's treatment plan.  HOME CARE   Take your heart medicine as told by your doctor.    Do not stop taking medicine unless your doctor tells you to.    Do not skip any dose of medicine.    Refill your medicines before they run out.    Take other medicines only as told by your doctor or pharmacist.   Stay active if told by your doctor. The elderly and people with severe heart failure should talk with a doctor about physical activity.   Eat heart-healthy foods. Choose foods that are without trans fat and are low in saturated fat, cholesterol, and salt (sodium). This includes fresh or frozen fruits and vegetables, fish, lean meats, fat-free or low-fat dairy foods, whole grains, and high-fiber foods. Lentils and dried peas and beans (legumes) are also good choices.   Limit salt if told by your doctor.   Cook in a healthy way. Roast, grill, broil, bake, poach, steam, or stir-fry foods.   Limit fluids as told by your doctor.   Weigh yourself every morning. Do this after you pee (urinate) and before you eat breakfast. Write down your weight to give to your doctor.   Take your blood pressure and write it down if your doctor tells you to.   Ask your doctor how to check your pulse. Check your pulse as told.   Lose weight if told by your doctor.   Stop smoking or chewing tobacco. Do not use gum or patches that help you quit without your doctor's approval.   Schedule and go to doctor visits as told.   Nonpregnant women should have no more than 1 drink a day. Men should have no more than 2 drinks a day. Talk to your doctor about drinking alcohol.   Stop illegal drug use.   Stay current with shots (immunizations).   Manage your health conditions as told by your doctor.   Learn to  manage your stress.   Rest when you are tired.   If it is really hot outside:    Avoid intense activities.    Use air conditioning or fans, or get in a cooler place.    Avoid caffeine and alcohol.    Wear loose-fitting, lightweight, and light-colored clothing.   If it is really cold outside:    Avoid intense activities.    Layer your clothing.    Wear mittens or gloves, a hat, and a scarf when going outside.    Avoid alcohol.   Learn about heart failure and get support as needed.   Get help to maintain or improve your quality of life and your ability to care for yourself as needed.  GET HELP IF:    You gain weight quickly.   You are more short of breath than usual.   You cannot do your normal activities.   You tire easily.   You cough more than normal, especially with activity.   You have any or more puffiness (swelling) in areas such as your hands, feet, ankles, or belly (abdomen).   You cannot sleep because it is hard to breathe.   You feel like your heart is beating fast (palpitations).   You get dizzy or light-headed when you stand up.  GET HELP   RIGHT AWAY IF:    You have trouble breathing.   There is a change in mental status, such as becoming less alert or not being able to focus.   You have chest pain or discomfort.   You faint.  MAKE SURE YOU:    Understand these instructions.   Will watch your condition.   Will get help right away if you are not doing well or get worse.     This information is not intended to replace advice given to you by your health care provider. Make sure you discuss any questions you have with your health care provider.     Document Released: 03/11/2008 Document Revised: 06/23/2014 Document Reviewed: 07/19/2012  Elsevier Interactive Patient Education 2016 [employer]

## 2015-08-06 NOTE — Clinical Social Work Placement (Signed)
   CLINICAL SOCIAL WORK PLACEMENT  NOTE  Date:  08/06/2015  Patient Details  Name: Shannon Todd MRN: [id number] Date of Birth: 1941/03/24  Clinical Social Work is seeking post-discharge placement for this patient at the Skilled  Nursing Facility level of care (*CSW will initial, date and re-position this form in  chart as items are completed):  Yes   Patient/family provided with [facility] Clinical Social Work Department's list of facilities offering this level of care within the geographic area requested by the patient (or if unable, by the patient's family).  Yes   Patient/family informed of their freedom to choose among providers that offer the needed level of care, that participate in Medicare, Medicaid or managed care program needed by the patient, have an available bed and are willing to accept the patient.  Yes   Patient/family informed of [facility]'s ownership interest in [facility] and [facility], as well as of the fact that they are under no obligation to receive care at these facilities.  PASRR submitted to EDS on       PASRR number received on       Existing PASRR number confirmed on 08/03/15     FL2 transmitted to all facilities in geographic area requested by pt/family on 08/03/15     FL2 transmitted to all facilities within larger geographic area on       Patient informed that his/her managed care company has contracts with or will negotiate with certain facilities, including the following:        Yes   Patient/family informed of bed offers received.  Patient chooses bed at [facility]     Physician recommends and patient chooses bed at  [facility])    Patient to be transferred to [facility] on 08/06/15.  Patient to be transferred to facility by [facility]     Patient family notified on 08/06/15 of transfer.  Name of family member notified:  Victorino Dike, daughter     PHYSICIAN       Additional Comment:     _______________________________________________ Dede Query, LCSW 08/06/2015, 1:05 PM

## 2015-08-06 NOTE — Progress Notes (Signed)
Subjective:  Patient is doing fair No acute events S Cr slightly improved to 2.05   Objective:  Vital signs in last 24 hours:  Temp:  [98.3 F (36.8 C)-98.8 F (37.1 C)] 98.7 F (37.1 C) (02/20 0445) Pulse Rate:  [61-70] 62 (02/20 0445) Resp:  [18-22] 18 (02/20 0445) BP: (142-170)/(61-75) 170/65 mmHg (02/20 0445) SpO2:  [92 %-95 %] 92 % (02/20 0445)  Weight change:  Filed Weights   08/03/15 0546  Weight: 79.697 kg (175 lb 11.2 oz)    Intake/Output:   No intake or output data in the 24 hours ending 08/06/15 1245   Physical Exam: General: NAD  HEENT anicteric  Neck cupple  Pulm/lungs Coarse breath sounds, normal effort  CVS/Heart No rub, gallops  Abdomen:  Soft, NT  Extremities: Some dependent edema  Neurologic: alert  Skin: Warm dry  Access:        Basic Metabolic Panel:   Recent Labs Lab 08/02/15 2213 08/03/15 0551 08/04/15 0750 08/05/15 0426 08/06/15 0500  NA 135 137 135 137 137  K 5.1 4.5 4.7 4.2 4.1  CL 105 101 103 103 102  CO2 24 27 27 27 26   GLUCOSE 167* 128* 92 97 98  BUN 29* 33* 42* 47* 44*  CREATININE 1.74* 1.91* 2.23* 2.28* 2.05*  CALCIUM 7.3* 8.2* 7.5* 7.9* 8.2*     CBC:  Recent Labs Lab 08/02/15 2213 08/03/15 0551 08/06/15 0500  WBC 5.1 7.7 4.3  HGB 11.3* 11.7* 12.3  HCT 34.0* 35.5 36.7  MCV 88.6 91.2 88.3  PLT 103* 108* 116*      Microbiology:  Recent Results (from the past 720 hour(s))  Blood culture (routine x 2)     Status: None (Preliminary result)   Collection Time: 08/02/15 10:13 PM  Result Value Ref Range Status   Specimen Description BLOOD LEFT HAND  Final   Special Requests BOTTLES DRAWN AEROBIC AND ANAEROBIC 5ML  Final   Culture NO GROWTH 4 DAYS  Final   Report Status PENDING  Incomplete  Urine culture     Status: None   Collection Time: 08/02/15 10:34 PM  Result Value Ref Range Status   Specimen Description URINE, RANDOM  Final   Special Requests NONE  Final   Culture >=100,000 COLONIES/mL  AEROCOCCUS URINAE  Final   Report Status 08/05/2015 FINAL  Final  Rapid Influenza A&B Antigens ([facility] only)     Status: None   Collection Time: 08/02/15 10:34 PM  Result Value Ref Range Status   Influenza A ([facility]) NOT DETECTED  Final   Influenza B ([facility]) NOT DETECTED  Final  Blood culture (routine x 2)     Status: None (Preliminary result)   Collection Time: 08/02/15 10:49 PM  Result Value Ref Range Status   Specimen Description BLOOD BLOOD RIGHT FOREARM  Final   Special Requests BOTTLES DRAWN AEROBIC AND ANAEROBIC 5ML  Final   Culture NO GROWTH 4 DAYS  Final   Report Status PENDING  Incomplete    Coagulation Studies: No results for input(s): LABPROT, INR in the last 72 hours.  Urinalysis: No results for input(s): COLORURINE, LABSPEC, PHURINE, GLUCOSEU, HGBUR, BILIRUBINUR, KETONESUR, PROTEINUR, UROBILINOGEN, NITRITE, LEUKOCYTESUR in the last 72 hours.  Invalid input(s): APPERANCEUR    Imaging: Dg Chest 1 View  08/06/2015  CLINICAL DATA:  CHF. EXAM: CHEST 1 VIEW COMPARISON:  08/04/2015. FINDINGS: Stable enlarged cardiac silhouette and post CABG changes. Stable prominence of the pulmonary vasculature and interstitial markings. Interval small bilateral pleural effusions. Resolved patchy  opacity in the right mid lung zone. Stable small left upper lobe calcified granuloma. Diffuse osteopenia. IMPRESSION: 1. Resolved right lung airspace opacity. 2. Interval small bilateral pleural effusions. 3. Otherwise, stable mild changes of congestive heart failure and stable cardiomegaly. Electronically Signed   By: Claudie Revering M.D.   On: 08/06/2015 07:16     Medications:     . amLODipine  10 mg Oral Daily  . aspirin EC  81 mg Oral Daily  . citalopram  10 mg Oral Daily  . clopidogrel  75 mg Oral Daily  . donepezil  23 mg Oral QHS  . enoxaparin (LOVENOX) injection  30 mg Subcutaneous Q24H  . furosemide  20 mg Intravenous Daily  . insulin aspart  0-5 Units Subcutaneous QHS  . insulin aspart   0-9 Units Subcutaneous TID WC  . levofloxacin (LEVAQUIN) IV  250 mg Intravenous Q24H  . levothyroxine  112 mcg Oral QAC breakfast  . memantine  7 mg Oral Daily  . oseltamivir  30 mg Oral Daily  . oxybutynin  5 mg Oral BID  . pantoprazole  40 mg Oral QAC breakfast  . simvastatin  40 mg Oral Daily   acetaminophen **OR** acetaminophen, ipratropium-albuterol, ondansetron **OR** ondansetron (ZOFRAN) IV  Assessment/ Plan:  75 y.o. female with gout, hypertension, hypothyroidism, dementia, diabetes mellitus type II, hyperlipidemia, overactive bladder, who was admitted to [facility] on 08/02/2015  1. Acute renal failure on chronic kidney disease stage IV: proteinuria on presentation. Baseline creatinine of 1.74, eGFR of 27 on admission. Acute kidney failure from acute illness and poor PO intake. Acute cardiorenal syndrome may be contributing.  Chronic kidney disease and proteinuria seems to be secondary to diabetes and hypertension. - S Cr is slightly improved - continue to monitor   2. Diabetes Mellitus type II with chronic kidney disease:  Hemoglobin A1c of 4.9. Home regimen of glipizide.   3. Urinary Tract infection: urine culture with aerococcus urinae (sensitivites for this bacteria are not done at this hospital). Treated with ceftriaxone and now on levofloxacin   LOS: 3 Shannon Todd 2/20/201712:45 PM

## 2015-08-06 NOTE — Progress Notes (Signed)
Patient discharged to [facility] per MD order. Report called to [facility] at facility. Will call EMS for transport.

## 2015-08-06 NOTE — Care Management Important Message (Signed)
Important Message  Patient Details  Name: Shannon Todd MRN: [id number] Date of Birth: 14-Sep-1940   Medicare Important Message Given:  Yes    Gwenette Greet, RN 08/06/2015, 11:47 AM

## 2015-08-06 NOTE — Discharge Summary (Signed)
[facility] [place] [facility]   PATIENT NAME: Shannon Todd    MR#:  [id number]  DATE OF BIRTH:  August 23, 1940  DATE OF ADMISSION:  08/02/2015 ADMITTING PHYSICIAN: Ihor Austin, MD  DATE OF DISCHARGE: 08/06/2015  PRIMARY CARE PHYSICIAN: Delton Prairie, MD    ADMISSION DIAGNOSIS:  UTI (lower urinary tract infection) [N39.0] Altered mental status, unspecified altered mental status type [R41.82]  DISCHARGE DIAGNOSIS:  Active Problems:   Altered mental status  acute on chronic systolic heart failure Acute on chronic kidney disease stage IV Acute hypoxic respiratory failure due to pneumonia and/or heart failure Acute on chronic metabolic encephalopathy SECONDARY DIAGNOSIS:   Past Medical History  Diagnosis Date  . Diabetes mellitus without complication (HCC)   . Thyroid disease   . Stroke (HCC)   . Dementia     HOSPITAL COURSE:  75 year old Caucasian female history of dementia admitted 08/02/15 with fever and worsening mental status.  Please see Dr. Mosetta Putt dated history and physical for further details.  1. Acute on chronic metabolic encephalopathy: Viral in etiology Although influenza test was negative she has been exposed to influenza and therefore was started on Tamiflu.   2. Hyperkalemia: Resolved  3. Acute on chronic kidney disease stage IV: Close to her baseline  4. Acute hypoxic respiratory failure: Due to pneumonia versus pulmonary edema/CHF  5.  Acute on chronic systolic heart failure: Well compensated at this time, unable to diurese much due to her acute renal failure.  Echo showing LVEF of 25-30%  DISCHARGE CONDITIONS:   Stable  CONSULTS OBTAINED:  Treatment Team:  Lamont Dowdy, MD  DRUG ALLERGIES:   Allergies  Allergen Reactions  . Sulfa Antibiotics Rash    DISCHARGE MEDICATIONS:   Current Discharge Medication List    START taking these medications   Details  levofloxacin (LEVAQUIN) 250 MG tablet Take 1 tablet (250  mg total) by mouth daily. Qty: 3 tablet, Refills: 0    oseltamivir (TAMIFLU) 30 MG capsule Take 1 capsule (30 mg total) by mouth daily. Qty: 3 capsule, Refills: 0      CONTINUE these medications which have NOT CHANGED   Details  allopurinol (ZYLOPRIM) 300 MG tablet Take 300 mg by mouth daily.    amLODipine (NORVASC) 10 MG tablet Take 10 mg by mouth daily.    aspirin 81 MG tablet Take 81 mg by mouth daily.    Cholecalciferol (VITAMIN D3) 1000 UNITS CAPS Take 1 capsule by mouth daily.     citalopram (CELEXA) 10 MG tablet Take 10 mg by mouth daily.    clopidogrel (PLAVIX) 75 MG tablet Take 75 mg by mouth daily.    dicyclomine (BENTYL) 20 MG tablet Take 20 mg by mouth 3 (three) times daily before meals.    donepezil (ARICEPT) 23 MG TABS tablet Take 23 mg by mouth at bedtime.    glipiZIDE (GLUCOTROL XL) 2.5 MG 24 hr tablet Take 2.5 mg by mouth daily with breakfast.    haloperidol (HALDOL) 0.5 MG tablet Take 0.5 mg by mouth 2 (two) times daily. 1-2 tablets by mouth every evening for confusion    levothyroxine (SYNTHROID, LEVOTHROID) 112 MCG tablet Take 112 mcg by mouth daily before breakfast.    memantine (NAMENDA XR) 7 MG CP24 24 hr capsule Take 7 mg by mouth daily.    omeprazole (PRILOSEC) 20 MG capsule Take 20 mg by mouth daily.    oxybutynin (DITROPAN) 5 MG tablet Take 5 mg by mouth 2 (two) times daily.  promethazine (PHENERGAN) 12.5 MG tablet Take 12.5 mg by mouth every 6 (six) hours as needed for nausea or vomiting.    simvastatin (ZOCOR) 40 MG tablet Take 40 mg by mouth daily.    traMADol (ULTRAM) 50 MG tablet Take 1 tablet (50 mg total) by mouth every 6 (six) hours as needed. Qty: 20 tablet, Refills: 0         DISCHARGE INSTRUCTIONS:    DIET:  Cardiac diet and Renal diet  DISCHARGE CONDITION:  Good  ACTIVITY:  Activity as tolerated  OXYGEN:  Home Oxygen: No.   Oxygen Delivery: room air  DISCHARGE LOCATION:  nursing home   If you experience  worsening of your admission symptoms, develop shortness of breath, life threatening emergency, suicidal or homicidal thoughts you must seek medical attention immediately by calling 911 or calling your MD immediately  if symptoms less severe.  You Must read complete instructions/literature along with all the possible adverse reactions/side effects for all the Medicines you take and that have been prescribed to you. Take any new Medicines after you have completely understood and accpet all the possible adverse reactions/side effects.   Please note  You were cared for by a hospitalist during your hospital stay. If you have any questions about your discharge medications or the care you received while you were in the hospital after you are discharged, you can call the unit and asked to speak with the hospitalist on call if the hospitalist that took care of you is not available. Once you are discharged, your primary care physician will handle any further medical issues. Please note that NO REFILLS for any discharge medications will be authorized once you are discharged, as it is imperative that you return to your primary care physician (or establish a relationship with a primary care physician if you do not have one) for your aftercare needs so that they can reassess your need for medications and monitor your lab values.    On the day of Discharge:  VITAL SIGNS:  Blood pressure 170/65, pulse 62, temperature 98.7 F (37.1 C), temperature source Oral, resp. rate 18, height 5\' 7"  (1.702 m), weight 79.697 kg (175 lb 11.2 oz), SpO2 92 %. PHYSICAL EXAMINATION:  GENERAL:  75 y.o.-year-old patient lying in the bed with no acute distress.  EYES: Pupils equal, round, reactive to light and accommodation. No scleral icterus. Extraocular muscles intact.  HEENT: Head atraumatic, normocephalic. Oropharynx and nasopharynx clear.  NECK:  Supple, no jugular venous distention. No thyroid enlargement, no tenderness.   LUNGS: Normal breath sounds bilaterally, no wheezing, rales,rhonchi or crepitation. No use of accessory muscles of respiration.  CARDIOVASCULAR: S1, S2 normal. No murmurs, rubs, or gallops.  ABDOMEN: Soft, non-tender, non-distended. Bowel sounds present. No organomegaly or mass.  EXTREMITIES: No pedal edema, cyanosis, or clubbing.  NEUROLOGIC: Cranial nerves II through XII are intact. Muscle strength 5/5 in all extremities. Sensation intact. Gait not checked.  PSYCHIATRIC: The patient is alert and oriented x 3.  SKIN: No obvious rash, lesion, or ulcer.  DATA REVIEW:   CBC  Recent Labs Lab 08/06/15 0500  WBC 4.3  HGB 12.3  HCT 36.7  PLT 116*    Chemistries   Recent Labs Lab 08/02/15 2213  08/06/15 0500  NA 135  < > 137  K 5.1  < > 4.1  CL 105  < > 102  CO2 24  < > 26  GLUCOSE 167*  < > 98  BUN 29*  < > 44*  CREATININE 1.74*  < > 2.05*  CALCIUM 7.3*  < > 8.2*  AST 24  --   --   ALT 8*  --   --   ALKPHOS 57  --   --   BILITOT 1.0  --   --   < > = values in this interval not displayed.  Cardiac Enzymes No results for input(s): TROPONINI in the last 168 hours.  Microbiology Results  Results for orders placed or performed during the hospital encounter of 08/02/15  Blood culture (routine x 2)     Status: None (Preliminary result)   Collection Time: 08/02/15 10:13 PM  Result Value Ref Range Status   Specimen Description BLOOD LEFT HAND  Final   Special Requests BOTTLES DRAWN AEROBIC AND ANAEROBIC  Final   Culture NO GROWTH 4 DAYS  Final   Report Status PENDING  Incomplete  Urine culture     Status: None   Collection Time: 08/02/15 10:34 PM  Result Value Ref Range Status   Specimen Description URINE, RANDOM  Final   Special Requests NONE  Final   Culture >=100,000 COLONIES/mL AEROCOCCUS URINAE  Final   Report Status 08/05/2015 FINAL  Final  Rapid Influenza A&B Antigens ([facility] only)     Status: None   Collection Time: 08/02/15 10:34 PM  Result Value Ref Range  Status   Influenza A ([facility]) NOT DETECTED  Final   Influenza B ([facility]) NOT DETECTED  Final  Blood culture (routine x 2)     Status: None (Preliminary result)   Collection Time: 08/02/15 10:49 PM  Result Value Ref Range Status   Specimen Description BLOOD BLOOD RIGHT FOREARM  Final   Special Requests BOTTLES DRAWN AEROBIC AND ANAEROBIC  Final   Culture NO GROWTH 4 DAYS  Final   Report Status PENDING  Incomplete    RADIOLOGY:  Dg Chest 1 View  08/06/2015  CLINICAL DATA:  CHF. EXAM: CHEST 1 VIEW COMPARISON:  08/04/2015. FINDINGS: Stable enlarged cardiac silhouette and post CABG changes. Stable prominence of the pulmonary vasculature and interstitial markings. Interval small bilateral pleural effusions. Resolved patchy opacity in the right mid lung zone. Stable small left upper lobe calcified granuloma. Diffuse osteopenia. IMPRESSION: 1. Resolved right lung airspace opacity. 2. Interval small bilateral pleural effusions. 3. Otherwise, stable mild changes of congestive heart failure and stable cardiomegaly. Electronically Signed   By: Beckie Salts M.D.   On: 08/06/2015 07:16   I have discussed her discharge plan with her daughter, Shannon Todd, who is in agreement.  She will highly benefit from CHF clinic in cardiac rehabilitation for which they are in agreement.  Outpatient follow-up    Management plans discussed with the patient, family and they are in agreement.  CODE STATUS:     Code Status Orders        Start     Ordered   08/03/15 0449  Full code   Continuous     08/03/15 0448    Code Status History    Date Active Date Inactive Code Status Order ID Comments User Context   This patient has a current code status but no historical code status.      TOTAL TIME TAKING CARE OF THIS PATIENT: 45 minutes.    [facility], Goerge Mohr M.D on 08/06/2015 at 1:20 PM  Between 7am to 6pm - Pager - [phone]  After 6pm go to [email or web address] - password [email or web address] [facility]  [facility]  Office   [phone]  CC: Primary care  physician; Delton Prairie, MD   Note: This dictation was prepared with Dragon dictation along with smaller phrase technology. Any transcriptional errors that result from this process are unintentional.

## 2015-08-06 NOTE — Progress Notes (Signed)
Physical Therapy Treatment Patient Details Name: Shannon Todd MRN: [id number] DOB: 06-14-1941 Today's Date: 08/06/2015    History of Present Illness PT is here with AMS, has a history of CVA and dementia.  She is on droplet precautions despite (-) flu test secondary to other symptoms.    PT Comments    Pt demonstrated progression towards functional goals. She required min guard for bed mobility. Transfers and ambulation of 40 ft were done with FWW and min A for balance and safety.  At the start of the session pt's O2 was 85 in supine on RA, but once she sat up and performed purse lip breathing is increased and maintained at 94% RA. RN notified. Pt remains pleasantly confused and is oriented to person and place. She continues to be on droplet precautions.   Follow Up Recommendations  SNF     Equipment Recommendations  Rolling walker with 5" wheels    Recommendations for Other Services       Precautions / Restrictions Precautions Precautions: Fall Restrictions Weight Bearing Restrictions: No    Mobility  Bed Mobility Overal bed mobility: Needs Assistance Bed Mobility: Supine to Sit;Sit to Supine     Supine to sit: Min guard Sit to supine: Min guard   General bed mobility comments: uses rail  Transfers Overall transfer level: Needs assistance Equipment used: Rolling walker (2 wheeled) Transfers: Sit to/from [employer] Sit to Stand: Min assist Stand pivot transfers: Min assist       General transfer comment: Cues for safety, turning completely and backing up to chair.  Ambulation/Gait Ambulation/Gait assistance: Min assist Ambulation Distance (Feet): 40 Feet Assistive device: Rolling walker (2 wheeled) Gait Pattern/deviations: Decreased stride length;Shuffle;Trunk flexed;Narrow base of support     General Gait Details: VC for staying close to FWW, increase BOS   Stairs            Wheelchair Mobility    Modified Rankin (Stroke  Patients Only)       Balance Overall balance assessment: Needs assistance Sitting-balance support: Feet supported Sitting balance-Leahy Scale: Fair Sitting balance - Comments: poor posture Postural control: Posterior lean Standing balance support: Bilateral upper extremity supported Standing balance-Leahy Scale: Fair Standing balance comment: unsteady, requires B UE support to reduce sway                    Cognition Arousal/Alertness: Awake/alert Behavior During Therapy: Flat affect Overall Cognitive Status: History of cognitive impairments - at baseline                      Exercises Other Exercises Other Exercises: B LE therex: supine SLR; seated LAQ, marching, hip abd and add with manual resistance x10 each. Cue for technique and staying on task.     General Comments        Pertinent Vitals/Pain Pain Assessment: No/denies pain    Home Living                      Prior Function            PT Goals (current goals can now be found in the care plan section) Acute Rehab PT Goals Patient Stated Goal: pt unable to state PT Goal Formulation: Patient unable to participate in goal setting Time For Goal Achievement: 08/17/15 Potential to Achieve Goals: Good Progress towards PT goals: Progressing toward goals    Frequency  Min 2X/week    PT Plan Current plan remains  appropriate    Co-evaluation             End of Session Equipment Utilized During Treatment: Gait belt Activity Tolerance: Patient tolerated treatment well Patient left: in chair;with call bell/phone within reach;with chair alarm set     Time: 1135-1200 PT Time Calculation (min) (ACUTE ONLY): 25 min  Charges:  $Gait Training: 8-22 mins $Therapeutic Exercise: 8-22 mins                    G Codes:      Adelene Idler, PT, DPT  08/06/2015, 2:15 PM [phone]

## 2015-08-06 NOTE — ED Notes (Signed)
Patient transported to CT 

## 2015-08-06 NOTE — ED Provider Notes (Signed)
[facility] Emergency Department Provider Note  ____________________________________________  Time seen: Approximately 10:56 PM  I have reviewed the triage vital signs and the nursing notes.   HISTORY  Chief Complaint Fall    HPI Shannon Todd is a 75 y.o. female discharged today  TO [facility] after being diagnosed with a viral illness, possibly influenza.  Patient states that she was told to call for assistance, butinstead got out of bed andslipped forward. She fell striking her head over the bed frame. There was no loss of consciousness. She states she has pain and bruising over the right forehead, otherwise no concerns. Did not injure the back hips or neck. No numbness tingling or weakness.  Patient's daughter is with her and states that the patient does require assistance, and frequently forgets to notify when needed. Otherwise acting at her normal baseline.Denies shortness of breath. States overall she feels improved except for this fall. No chest pain, trouble breathing, racing heart or shortness of breath.   Past Medical History  Diagnosis Date  . Diabetes mellitus without complication (HCC)   . Thyroid disease   . Stroke (HCC)   . Dementia     Patient Active Problem List   Diagnosis Date Noted  . Altered mental status 08/03/2015    Past Surgical History  Procedure Laterality Date  . Arterial bypass surgry    . Abdominal hysterectomy      Current Outpatient Rx  Name  Route  Sig  Dispense  Refill  . allopurinol (ZYLOPRIM) 300 MG tablet   Oral   Take 300 mg by mouth daily.         Marland Kitchen amLODipine (NORVASC) 10 MG tablet   Oral   Take 10 mg by mouth daily.         Marland Kitchen aspirin 81 MG tablet   Oral   Take 81 mg by mouth daily.         . Cholecalciferol (VITAMIN D3) 1000 UNITS CAPS   Oral   Take 1 capsule by mouth daily.          . citalopram (CELEXA) 10 MG tablet   Oral   Take 10 mg by mouth daily.         .  clopidogrel (PLAVIX) 75 MG tablet   Oral   Take 75 mg by mouth daily.         Marland Kitchen dicyclomine (BENTYL) 20 MG tablet   Oral   Take 20 mg by mouth 3 (three) times daily before meals.         . donepezil (ARICEPT) 23 MG TABS tablet   Oral   Take 23 mg by mouth at bedtime.         Marland Kitchen glipiZIDE (GLUCOTROL XL) 2.5 MG 24 hr tablet   Oral   Take 2.5 mg by mouth daily with breakfast.         . haloperidol (HALDOL) 0.5 MG tablet   Oral   Take 0.5 mg by mouth 2 (two) times daily. 1-2 tablets by mouth every evening for confusion         . levofloxacin (LEVAQUIN) 250 MG tablet   Oral   Take 1 tablet (250 mg total) by mouth daily.   3 tablet   0   . levothyroxine (SYNTHROID, LEVOTHROID) 112 MCG tablet   Oral   Take 112 mcg by mouth daily before breakfast.         . memantine (NAMENDA XR) 7 MG CP24  24 hr capsule   Oral   Take 7 mg by mouth daily.         Marland Kitchen omeprazole (PRILOSEC) 20 MG capsule   Oral   Take 20 mg by mouth daily.         Marland Kitchen oseltamivir (TAMIFLU) 30 MG capsule   Oral   Take 1 capsule (30 mg total) by mouth daily.   3 capsule   0   . oxybutynin (DITROPAN) 5 MG tablet   Oral   Take 5 mg by mouth 2 (two) times daily.         . promethazine (PHENERGAN) 12.5 MG tablet   Oral   Take 12.5 mg by mouth every 6 (six) hours as needed for nausea or vomiting.         . simvastatin (ZOCOR) 40 MG tablet   Oral   Take 40 mg by mouth daily.         . traMADol (ULTRAM) 50 MG tablet   Oral   Take 1 tablet (50 mg total) by mouth every 6 (six) hours as needed. Patient taking differently: Take 50 mg by mouth every 6 (six) hours as needed for moderate pain.    20 tablet   0     Allergies Sulfa antibiotics  History reviewed. No pertinent family history.  Social History Social History  Substance Use Topics  . Smoking status: Never Smoker   . Smokeless tobacco: Never Used  . Alcohol Use: No    Review of Systems Constitutional: No  fever/chills Eyes: No visual changes. ENT: No sore throat. Cardiovascular: Denies chest pain. Respiratory: Denies shortness of breath. Gastrointestinal: No abdominal pain.  No nausea, no vomiting.  No diarrhea.  No constipation. Genitourinary: Negative for dysuria. Musculoskeletal: Negative for back pain.as a bruise over the left hip which is been present after falling about a week ago. Denies pain in the hips or legs or back. No injury to the arms. Skin: Negative for rash. Neurological: Negative for focal weakness or numbness.  10-point ROS otherwise negative. p-to-date immunizations ____________________________________________   PHYSICAL EXAM:  VITAL SIGNS: ED Triage Vitals  Enc Vitals Group     BP 08/06/15 2223 161/78 mmHg     Pulse Rate 08/06/15 2223 58     Resp 08/06/15 2223 20     Temp 08/06/15 2223 99.4 F (37.4 C)     Temp Source 08/06/15 2223 Oral     SpO2 08/06/15 2223 91 %     Weight 08/06/15 2223 172 lb 4.8 oz (78.155 kg)     Height 08/06/15 2223  (1.676 m)     Head Cir --      Peak Flow --      Pain Score --      Pain Loc --      Pain Edu? --      Excl. in GC? --    Constitutional: Alert and oriented. Well appearing and in no acute distress. Eyes: Conjunctivae are normal. PERRL. EOMI. Head: Atraumatic except for a large right frontal hematoma with overlying abrasion.o laceration. Bleeding is controlled. Nose: No congestion/rhinnorhea. Mouth/Throat: Mucous membranes are moist.  Oropharynx non-erythematous. Neck: No stridor.  No cervical spine tenderness. Cardiovascular: Normal rate, regular rhythm. Grossly normal heart sounds.  Good peripheral circulation. Respiratory: Normal respiratory effort.  No retractions. Lungs CTAB. Gastrointestinal: Soft and nontender. No distention. No abdominal bruits. No CVA tenderness. Musculoskeletal: No lower extremity tenderness nor edema. The patient does have a healing hematoma over the  left posterior hip.Good and  normal range of motion of all extremities without pain or evidence of acute injury. No joint effusions. Neurologic:  Normal speech and language. No gross focal neurologic deficits are appreciated. Normal swallow. No pronator drift. Moves all extremities 5 out of 5. No facial droop.No numbness or tingling in the hands or feet.Skin:  Skin is warm, dry and intact. No rash noted. Psychiatric: Mood and affect are calm.  ____________________________________________   LABS (all labs ordered are listed, but only abnormal results are displayed)  Labs Reviewed  GLUCOSE, CAPILLARY - Abnormal; Notable for the following:    Glucose-Capillary 101 (*)    All other components within normal limits  CBG MONITORING, ED   ____________________________________________  EKG  History reviewed and interpreted by me at 2210 Ventricular rate 60 QRS 120 QTc 470 Probable left ventricular hypertrophy, mild biphasic T-wave abnormality without evidence of acute ST elevation. Of note the patient is not having any chest pain or trouble breathing. ____________________________________________  RADIOLOGY   ____________________________________________   PROCEDURES  Procedure(s) performed: None  Critical Care performed: No  ____________________________________________   INITIAL IMPRESSION / ASSESSMENT AND PLAN / ED COURSE  Pertinent labs & imaging results that were available during my care of the patient were reviewed by me and considered in my medical decision making (see chart for details).  Patient presents after a fall out of bed. She is hemodynamically stable and in no distress but has notable right frontal hematoma. Awake and alert. We'll obtain CT imaging as the patient does use Plavix, exclude intracranial hemorrhage. No evidence of cervical spine fracture but given patient age we will obtain CT imaging of the cervical spine as well. She otherwise appears stable, and discussed with the patient and  family would like to go back to nursing facility this evening if no evidence of intracranial injury. I think this is agreeable.  Follow-up CT head and neck assigned to Dr. York Cerise. Likely discharge back after reevaluation if no evidence of intracranial injury or cervical spine fracture. ____________________________________________   FINAL CLINICAL IMPRESSION(S) / ED DIAGNOSES  Final diagnoses:  Head injury due to trauma, initial encounter  Hematoma of frontal scalp, initial encounter      Sharyn Creamer, MD 08/06/15 2357

## 2015-08-06 NOTE — Progress Notes (Signed)
Clinical Social Worker informed by Delfino Lovett, MD that patient is medically ready to discharge to SNF, Patient and  Daughter Victorino Dike are in a agreement with plan. Call to [facility] to confirm that patient's bed is ready. Provided patient's room number 43 and number to call for report [phone] . All discharge information faxed to  Facility. No Rx's for packet.   RN will call report and patient will discharge to [facility] via EMS.  Sammuel Hines. LCSWA Clinical Social Work Department [phone] 1:37 PM

## 2015-08-07 LAB — CULTURE, BLOOD (ROUTINE X 2)
CULTURE: NO GROWTH
Culture: NO GROWTH

## 2015-08-07 NOTE — Discharge Instructions (Signed)
You were seen in the Emergency Department (ED) today for a head injury.  Based on your evaluation, you may have sustained a hematoma to the forehead.  If you had a CT scan done, it did not show any evidence of serious injury or bleeding internally.    Symptoms to expect from a head injury include nausea, mild to moderate headache, difficulty concentrating or sleeping, and mild lightheadedness.  These symptoms should improve over the next few days to weeks, but it may take many weeks before you feel back to normal.  Return to the emergency department or follow-up with your primary care doctor if your symptoms are not improving over this time.  Signs of a more serious head injury include vomiting, severe headache, excessive sleepiness or confusion, and weakness or numbness in your face, arms or legs.  Return immediately to the Emergency Department if you experience any of these more concerning symptoms.    Your CT scan showed that you have a nodule on your thyroid.  There is no indication for an emergent testing of this, but you should discuss it with your primary care doctor, who may want to schedule you for additional outpatient testing (such as an ultrasound).  Similarly, there is evidence of some blockage of your carotid arteries which is very common in older patients, and your regular doctor may wish to perform some additional outpatient testing of your carotids as well.   Hematoma A hematoma is a collection of blood under the skin, in an organ, in a body space, in a joint space, or in other tissue. The blood can clot to form a lump that you can see and feel. The lump is often firm and may sometimes become sore and tender. Most hematomas get better in a few days to weeks. However, some hematomas may be serious and require medical care. Hematomas can range in size from very small to very large. CAUSES  A hematoma can be caused by a blunt or penetrating injury. It can also be caused by spontaneous  leakage from a blood vessel under the skin. Spontaneous leakage from a blood vessel is more likely to occur in older people, especially those taking blood thinners. Sometimes, a hematoma can develop after certain medical procedures. SIGNS AND SYMPTOMS   A firm lump on the body.  Possible pain and tenderness in the area.  Bruising.Blue, dark blue, purple-red, or yellowish skin may appear at the site of the hematoma if the hematoma is close to the surface of the skin. For hematomas in deeper tissues or body spaces, the signs and symptoms may be subtle. For example, an intra-abdominal hematoma may cause abdominal pain, weakness, fainting, and shortness of breath. An intracranial hematoma may cause a headache or symptoms such as weakness, trouble speaking, or a change in consciousness. DIAGNOSIS  A hematoma can usually be diagnosed based on your medical history and a physical exam. Imaging tests may be needed if your health care provider suspects a hematoma in deeper tissues or body spaces, such as the abdomen, head, or chest. These tests may include ultrasonography or a CT scan.  TREATMENT  Hematomas usually go away on their own over time. Rarely does the blood need to be drained out of the body. Large hematomas or those that may affect vital organs will sometimes need surgical drainage or monitoring. HOME CARE INSTRUCTIONS   Apply ice to the injured area:   Put ice in a plastic bag.   Place a towel between your skin  and the bag.   Leave the ice on for 20 minutes, 2-3 times a day for the first 1 to 2 days.   After the first 2 days, switch to using warm compresses on the hematoma.   Elevate the injured area to help decrease pain and swelling. Wrapping the area with an elastic bandage may also be helpful. Compression helps to reduce swelling and promotes shrinking of the hematoma. Make sure the bandage is not wrapped too tight.   If your hematoma is on a lower extremity and is painful,  crutches may be helpful for a couple days.   Only take over-the-counter or prescription medicines as directed by your health care provider. SEEK IMMEDIATE MEDICAL CARE IF:   You have increasing pain, or your pain is not controlled with medicine.   You have a fever.   You have worsening swelling or discoloration.   Your skin over the hematoma breaks or starts bleeding.   Your hematoma is in your chest or abdomen and you have weakness, shortness of breath, or a change in consciousness.  Your hematoma is on your scalp (caused by a fall or injury) and you have a worsening headache or a change in alertness or consciousness. MAKE SURE YOU:   Understand these instructions.  Will watch your condition.  Will get help right away if you are not doing well or get worse.   This information is not intended to replace advice given to you by your health care provider. Make sure you discuss any questions you have with your health care provider.   Document Released: 01/15/2004 Document Revised: 02/02/2013 Document Reviewed: 11/10/2012 Elsevier Interactive Patient Education 2016 [employer]  Head Injury, Adult You have a head injury. Headaches and throwing up (vomiting) are common after a head injury. It should be easy to wake up from sleeping. Sometimes you must stay in the hospital. Most problems happen within the first 24 hours. Side effects may occur up to 7-10 days after the injury.  WHAT ARE THE TYPES OF HEAD INJURIES? Head injuries can be as minor as a bump. Some head injuries can be more severe. More severe head injuries include:  A jarring injury to the brain (concussion).  A bruise of the brain (contusion). This mean there is bleeding in the brain that can cause swelling.  A cracked skull (skull fracture).  Bleeding in the brain that collects, clots, and forms a bump (hematoma). WHEN SHOULD I GET HELP RIGHT AWAY?   You are confused or sleepy.  You cannot be woken  up.  You feel sick to your stomach (nauseous) or keep throwing up (vomiting).  Your dizziness or unsteadiness is getting worse.  You have very bad, lasting headaches that are not helped by medicine. Take medicines only as told by your doctor.  You cannot use your arms or legs like normal.  You cannot walk.  You notice changes in the black spots in the center of the colored part of your eye (pupil).  You have clear or bloody fluid coming from your nose or ears.  You have trouble seeing. During the next 24 hours after the injury, you must stay with someone who can watch you. This person should get help right away (call 911 in the [place] if you start to shake and are not able to control it (have seizures), you pass out, or you are unable to wake up. HOW CAN I PREVENT A HEAD INJURY IN THE FUTURE?  Wear seat belts.  Wear  a helmet while bike riding and playing sports like football.  Stay away from dangerous activities around the house. WHEN CAN I RETURN TO NORMAL ACTIVITIES AND ATHLETICS? See your doctor before doing these activities. You should not do normal activities or play contact sports until 1 week after the following symptoms have stopped:  Headache that does not go away.  Dizziness.  Poor attention.  Confusion.  Memory problems.  Sickness to your stomach or throwing up.  Tiredness.  Fussiness.  Bothered by bright lights or loud noises.  Anxiousness or depression.  Restless sleep. MAKE SURE YOU:   Understand these instructions.  Will watch your condition.  Will get help right away if you are not doing well or get worse.   This information is not intended to replace advice given to you by your health care provider. Make sure you discuss any questions you have with your health care provider.   Document Released: 05/15/2008 Document Revised: 06/23/2014 Document Reviewed: 02/07/2013 Elsevier Interactive Patient Education [employer]

## 2015-08-07 NOTE — ED Notes (Addendum)
RN cleaned hematoma and wrapped in curity bandage

## 2015-09-04 ENCOUNTER — Ambulatory Visit: Payer: Medicare Other | Admitting: Cardiology

## 2015-09-04 ENCOUNTER — Encounter: Payer: Self-pay | Admitting: *Deleted

## 2016-01-15 ENCOUNTER — Emergency Department
Admission: EM | Admit: 2016-01-15 | Discharge: 2016-01-15 | Disposition: A | Discharge status: Home / Self Care | Payer: Medicare HMO | Attending: Emergency Medicine | Admitting: Emergency Medicine

## 2016-01-15 ENCOUNTER — Emergency Department: Payer: Medicare HMO

## 2016-01-15 ENCOUNTER — Encounter: Payer: Self-pay | Admitting: Emergency Medicine

## 2016-01-15 DIAGNOSIS — Z7982 Long term (current) use of aspirin: Secondary | ICD-10-CM | POA: Diagnosis not present

## 2016-01-15 DIAGNOSIS — Y92239 Unspecified place in hospital as the place of occurrence of the external cause: Secondary | ICD-10-CM | POA: Diagnosis not present

## 2016-01-15 DIAGNOSIS — Z79899 Other long term (current) drug therapy: Secondary | ICD-10-CM | POA: Diagnosis not present

## 2016-01-15 DIAGNOSIS — S51012A Laceration without foreign body of left elbow, initial encounter: Secondary | ICD-10-CM | POA: Diagnosis not present

## 2016-01-15 DIAGNOSIS — Z7984 Long term (current) use of oral hypoglycemic drugs: Secondary | ICD-10-CM | POA: Insufficient documentation

## 2016-01-15 DIAGNOSIS — W1800XA Striking against unspecified object with subsequent fall, initial encounter: Secondary | ICD-10-CM | POA: Diagnosis not present

## 2016-01-15 DIAGNOSIS — Y939 Activity, unspecified: Secondary | ICD-10-CM | POA: Diagnosis not present

## 2016-01-15 DIAGNOSIS — W19XXXA Unspecified fall, initial encounter: Secondary | ICD-10-CM

## 2016-01-15 DIAGNOSIS — S0101XA Laceration without foreign body of scalp, initial encounter: Secondary | ICD-10-CM | POA: Insufficient documentation

## 2016-01-15 DIAGNOSIS — E119 Type 2 diabetes mellitus without complications: Secondary | ICD-10-CM | POA: Insufficient documentation

## 2016-01-15 DIAGNOSIS — Y999 Unspecified external cause status: Secondary | ICD-10-CM | POA: Diagnosis not present

## 2016-01-15 DIAGNOSIS — S0990XA Unspecified injury of head, initial encounter: Secondary | ICD-10-CM | POA: Diagnosis present

## 2016-01-15 DIAGNOSIS — Z8673 Personal history of transient ischemic attack (TIA), and cerebral infarction without residual deficits: Secondary | ICD-10-CM | POA: Insufficient documentation

## 2016-01-15 LAB — GLUCOSE, CAPILLARY: GLUCOSE-CAPILLARY: 157 mg/dL — AB (ref 65–99)

## 2016-01-15 MED ORDER — PENTAFLUOROPROP-TETRAFLUOROETH EX AERO: INHALATION_SPRAY | CUTANEOUS | Status: DC | PRN

## 2016-01-15 MED ORDER — PENTAFLUOROPROP-TETRAFLUOROETH EX AERO
INHALATION_SPRAY | CUTANEOUS | Status: AC
  Filled 2016-01-15: qty 30

## 2016-01-15 NOTE — ED Notes (Addendum)
No more bleeding noted to posterior head or L arm. Wrapping around head changed to clean bandage. EMS called for transport back to [facility].

## 2016-01-15 NOTE — ED Notes (Signed)
Pt has some blood noted to newly wrapped gauze, appears less than before. It's not saturated through, but blood is seen. Will change dressing soon to monitor bleeding.

## 2016-01-15 NOTE — ED Notes (Signed)
Went over D/C instructions with pt and family. They verbalized understanding and had no further questions. Daughter signed for pt.

## 2016-01-15 NOTE — ED Triage Notes (Signed)
Pt arrived via EMS. Pt fell off lift when staff at [facility] tried to transfer her after a bath to the bed. Pt has 1 cm lac to back of had. Matted with blood upon arrival. Two inch skin tear on arm covered with steri-strips. Pt states neck tenderness. C-collar applied at time of triage.

## 2016-01-15 NOTE — ED Notes (Signed)
With MD at bedside, 1 staple placed in patient head. Gebauer's spray applied to scalp before staple placed.

## 2016-01-15 NOTE — ED Provider Notes (Signed)
[facility] Emergency Department Provider Note   ____________________________________________   First MD Initiated Contact with Patient 01/15/16 1707     (approximate)  I have reviewed the triage vital signs and the nursing notes.   HISTORY  Chief Complaint Fall (Lac on head)  EM caveat: The patient has severe dementia and cannot recall today's event  HPI Shannon Todd is a 75 y.o. female the history of cognitive issues and dementia.Also dementia, diabetes.  Patient was reported to be transferring using a [employer] lift when an accident occurred and she fell. She landed on her left side and struck the back of her head. There is no reported loss of consciousness. The patient is complaining that the back of her head hurts.  She was hemodynamically stable with EMS. She was clearly noted to be a mechanical fall secondary to trouble with a [employer] lift. No preceding symptoms noted.   Past Medical History:  Diagnosis Date  . Dementia   . Diabetes mellitus without complication (HCC)   . Stroke (HCC)   . Thyroid disease     Patient Active Problem List   Diagnosis Date Noted  . Altered mental status 08/03/2015    Past Surgical History:  Procedure Laterality Date  . ABDOMINAL HYSTERECTOMY    . ARTERIAL BYPASS SURGRY      Prior to Admission medications   Medication Sig Start Date End Date Taking? Authorizing Provider  allopurinol (ZYLOPRIM) 300 MG tablet Take 300 mg by mouth daily.    Historical Provider, MD  amLODipine (NORVASC) 10 MG tablet Take 10 mg by mouth daily.    Historical Provider, MD  aspirin 81 MG tablet Take 81 mg by mouth daily.    Historical Provider, MD  Cholecalciferol (VITAMIN D3) 1000 UNITS CAPS Take 1 capsule by mouth daily.     Historical Provider, MD  citalopram (CELEXA) 10 MG tablet Take 10 mg by mouth daily.    Historical Provider, MD  clopidogrel (PLAVIX) 75 MG tablet Take 75 mg by mouth daily.    Historical Provider, MD    dicyclomine (BENTYL) 20 MG tablet Take 20 mg by mouth 3 (three) times daily before meals.    Historical Provider, MD  donepezil (ARICEPT) 23 MG TABS tablet Take 23 mg by mouth at bedtime.    Historical Provider, MD  glipiZIDE (GLUCOTROL XL) 2.5 MG 24 hr tablet Take 2.5 mg by mouth daily with breakfast.    Historical Provider, MD  haloperidol (HALDOL) 0.5 MG tablet Take 0.5 mg by mouth 2 (two) times daily. 1-2 tablets by mouth every evening for confusion    Historical Provider, MD  levofloxacin (LEVAQUIN) 250 MG tablet Take 1 tablet (250 mg total) by mouth daily. 08/06/15   Delfino Lovett, MD  levothyroxine (SYNTHROID, LEVOTHROID) 112 MCG tablet Take 112 mcg by mouth daily before breakfast.    Historical Provider, MD  memantine (NAMENDA XR) 7 MG CP24 24 hr capsule Take 7 mg by mouth daily.    Historical Provider, MD  omeprazole (PRILOSEC) 20 MG capsule Take 20 mg by mouth daily.    Historical Provider, MD  oseltamivir (TAMIFLU) 30 MG capsule Take 1 capsule (30 mg total) by mouth daily. 08/06/15   Delfino Lovett, MD  oxybutynin (DITROPAN) 5 MG tablet Take 5 mg by mouth 2 (two) times daily.    Historical Provider, MD  promethazine (PHENERGAN) 12.5 MG tablet Take 12.5 mg by mouth every 6 (six) hours as needed for nausea or vomiting.  Historical Provider, MD  simvastatin (ZOCOR) 40 MG tablet Take 40 mg by mouth daily.    Historical Provider, MD  traMADol (ULTRAM) 50 MG tablet Take 1 tablet (50 mg total) by mouth every 6 (six) hours as needed. Patient taking differently: Take 50 mg by mouth every 6 (six) hours as needed for moderate pain.  05/26/15   Jennye Moccasin, MD    Allergies Sulfa antibiotics  No family history on file.  Social History Social History  Substance Use Topics  . Smoking status: Never Smoker  . Smokeless tobacco: Never Used  . Alcohol use No    Review of Systems Constitutional: No fever/chills Eyes: No visual changes. ENT: No sore throat. States her neck feels achy in the  back Cardiovascular: Denies chest pain. Respiratory: Denies shortness of breath. Gastrointestinal: No abdominal pain.  No nausea, no vomiting.  No diarrhea.  No constipation. Genitourinary: Negative for dysuria. Musculoskeletal: Negative for back pain. Skin: Negative for rash. Bleeding was noted coming from the back of the scalp on the left. Neurological: Negative for headaches, focal weakness or numbness. Reports that she feels sore across the left back of her head.  10-point ROS otherwise negative.  ____________________________________________   PHYSICAL EXAM:  VITAL SIGNS: ED Triage Vitals [01/15/16 1651]  Enc Vitals Group     BP (!) 158/78     Pulse Rate 73     Resp 18     Temp 97.6 F (36.4 C)     Temp Source Oral     SpO2 95 %     Weight 163 lb (73.9 kg)     Height  (1.651 m)     Head Circumference      Peak Flow      Pain Score      Pain Loc      Pain Edu?      Excl. in GC?     Constitutional: Alert and orientedTo self and place but not to date and time. Well appearing and in no acute distress. Eyes: Conjunctivae are normal. PERRL. EOMI. Head: Atraumatic except for a small 1 cm linear laceration over the left posterior scalp. Nose: No congestion/rhinnorhea. Mouth/Throat: Mucous membranes are moist.  Oropharynx non-erythematous. Neck: No stridor.  C-collar in place. No midline cervical tenderness. Cardiovascular: Normal rate, regular rhythm. Grossly normal heart sounds.   Respiratory: Normal respiratory effort.  No retractions. Lungs CTAB. Gastrointestinal: Soft and nontender. No distention.  Musculoskeletal:  Lower Extremities  No edema. Normal DP/PT pulses bilateral with good cap refill.  Normal neuro-motor function lower extremities bilateral.  RIGHT Right lower extremity demonstrates normal strength, good use of all muscles. No edema bruising or contusions of the right hip, right knee, right ankle. Full range of motion of the right lower extremity  without pain. No pain on axial loading. No evidence of trauma.  LEFT Left lower extremity demonstrates normal strength, good use of all muscles. No edema bruising or contusions of the hip,  knee, ankle. Full range of motion of the left lower extremity without pain. No pain on axial loading. No evidence of trauma.  RIGHT Right upper extremity demonstrates normal strength, good use of all muscles. No edema bruising or contusions of the right shoulder/upper arm, right elbow, right forearm / hand. Full range of motion of the right right upper extremity without pain. No evidence of trauma. Strong radial pulse. Intact median/ulnar/radial neuro-muscular exam.  LEFT Left upper extremity demonstrates normal strength, good use of all muscles. No edema  bruising or contusions of the left shoulder/upper arm, left elbow, left forearm / hand except for an approximately 4 cm superficial skin tear which is already had Steri-Strips placed on it by the nursing facility. Full range of motion of the left  upper extremity without pain. Strong radial pulse. Intact median/ulnar/radial neuro-muscular exam.    No lower extremity tenderness nor edema.   Neurologic:  Normal speech and language. No gross focal neurologic deficits are appreciated. No gait instability. Skin:  Skin is warm, dry and intact. No rash noted. Psychiatric: Mood and affect are normal. Speech and behavior are normal.  ____________________________________________   LABS (all labs ordered are listed, but only abnormal results are displayed)  Labs Reviewed  GLUCOSE, CAPILLARY - Abnormal; Notable for the following:       Result Value   Glucose-Capillary 157 (*)    All other components within normal limits  CBG MONITORING, ED   ____________________________________________  EKG  Reviewed and interpreted by me at 1725 Heart rate 70 QRS 110 QTc 420 Probable left ventricular hypertrophy with associated change and mild prolongation of the  QT.  As compared with the patient's previous EKG from 08/06/2015 no significant changes are noted. ____________________________________________  RADIOLOGY Ct Head Wo Contrast  Result Date: 01/15/2016 CLINICAL DATA:  Larey Seat off lift at [facility] during transfer from bath to bed. Laceration back of head. EXAM: CT HEAD WITHOUT CONTRAST CT CERVICAL SPINE WITHOUT CONTRAST TECHNIQUE: Multidetector CT imaging of the head and cervical spine was performed following the standard protocol without intravenous contrast. Multiplanar CT image reconstructions of the cervical spine were also generated. COMPARISON:  08/06/2015 FINDINGS: CT HEAD FINDINGS Ventricles, cisterns and other CSF spaces are within normal as there is mild age related atrophic change. There is chronic ischemic microvascular disease. There is no focal mass, mass effect, shift of midline structures or acute hemorrhage. No evidence of acute infarction. Small scalp contusion over the left posterior parietal region. Underlying bony structures are within normal without evidence of fracture. Moderate calcified plaque over the cavernous segment of the internal carotid arteries as well as over the vertebral arteries. CT CERVICAL SPINE FINDINGS Vertebral body alignment and heights are normal. There is moderate spondylosis of the cervical spine with moderate disc space narrowing throughout the cervical spine unchanged. Prevertebral soft tissues are within normal. There is moderate uncovertebral joint spurring and mild facet arthropathy. Atlantoaxial articulation is within normal. There is bilateral neural foramina narrowing at multiple levels due to adjacent bony spurring. No acute fracture or subluxation. Prominence of the right lobe of the thyroid with 1.6 cm nodule without significant change. Calcified plaque over the carotid bifurcations and vertebral arteries. IMPRESSION: No acute intracranial findings. Small left posterior parietal scalp contusion.   No fracture. Chronic ischemic microvascular disease and mild age related atrophy. No acute cervical spine injury. Moderate spondylosis of the cervical spine with multilevel disc disease and multilevel neural foraminal narrowing. Stable 1.6 cm right thyroid nodule.  Consider follow-up ultrasound. Electronically Signed   By: Elberta Fortis M.D.   On: 01/15/2016 18:02   Ct Cervical Spine Wo Contrast  Result Date: 01/15/2016 CLINICAL DATA:  Larey Seat off lift at [facility] during transfer from bath to bed. Laceration back of head. EXAM: CT HEAD WITHOUT CONTRAST CT CERVICAL SPINE WITHOUT CONTRAST TECHNIQUE: Multidetector CT imaging of the head and cervical spine was performed following the standard protocol without intravenous contrast. Multiplanar CT image reconstructions of the cervical spine were also generated. COMPARISON:  08/06/2015 FINDINGS: CT HEAD  FINDINGS Ventricles, cisterns and other CSF spaces are within normal as there is mild age related atrophic change. There is chronic ischemic microvascular disease. There is no focal mass, mass effect, shift of midline structures or acute hemorrhage. No evidence of acute infarction. Small scalp contusion over the left posterior parietal region. Underlying bony structures are within normal without evidence of fracture. Moderate calcified plaque over the cavernous segment of the internal carotid arteries as well as over the vertebral arteries. CT CERVICAL SPINE FINDINGS Vertebral body alignment and heights are normal. There is moderate spondylosis of the cervical spine with moderate disc space narrowing throughout the cervical spine unchanged. Prevertebral soft tissues are within normal. There is moderate uncovertebral joint spurring and mild facet arthropathy. Atlantoaxial articulation is within normal. There is bilateral neural foramina narrowing at multiple levels due to adjacent bony spurring. No acute fracture or subluxation. Prominence of the right lobe of the  thyroid with 1.6 cm nodule without significant change. Calcified plaque over the carotid bifurcations and vertebral arteries. IMPRESSION: No acute intracranial findings. Small left posterior parietal scalp contusion.  No fracture. Chronic ischemic microvascular disease and mild age related atrophy. No acute cervical spine injury. Moderate spondylosis of the cervical spine with multilevel disc disease and multilevel neural foraminal narrowing. Stable 1.6 cm right thyroid nodule.  Consider follow-up ultrasound. Electronically Signed   By: Elberta Fortis M.D.   On: 01/15/2016 18:02    ____________________________________________   PROCEDURES  Procedure(s) performed: laceration  Procedures  Critical Care performed: No  LACERATION REPAIR Performed by: Sharyn Creamer Authorized by: Sharyn Creamer Consent: Verbal consent obtained. Risks and benefits: risks, benefits and alternatives were discussed Consent given by: patient Patient identity confirmed: provided demographic data Prepped and Draped in normal sterile fashion Wound explored  Laceration Location: Left scalp  Laceration Length: 1 cm  No Foreign Bodies seen or palpated  Anesthesia: local infiltration  Local anesthetic: Pain ease spray   Irrigation method: syringe Amount of cleaning: standard  Skin closure: Staple   Number of sutures: 1   Technique: Interrupted   Patient tolerance: Patient tolerated the procedure well with no immediate complications.  ____________________________________________   INITIAL IMPRESSION / ASSESSMENT AND PLAN / ED COURSE  Pertinent labs & imaging results that were available during my care of the patient were reviewed by me and considered in my medical decision making (see chart for details).  Patient presents after a fall from the lift. Obvious etiology because she was dropped all transferring. No preceding symptoms. She notes a mild headache, also a skin tear to the left arm otherwise no  obvious evidence of traumatic injury. Hemodynamics are stable. Laceration repaired. Family at bedside, agreeable with the plan to return her to the nursing facility and that they will follow-up for suture removal. Return precautions and treatment recommendations and follow-up discussed with the patient's family who is agreeable with the plan.  Patient will be returned by EMS, family states they cannot provide transport.  Clinical Course     ____________________________________________   FINAL CLINICAL IMPRESSION(S) / ED DIAGNOSES  Final diagnoses:  Fall, initial encounter  Skin tear of elbow without complication, left, initial encounter  Scalp laceration, initial encounter      NEW MEDICATIONS STARTED DURING THIS VISIT:  New Prescriptions   No medications on file     Note:  This document was prepared using Dragon voice recognition software and may include unintentional dictation errors.     Sharyn Creamer, MD 01/15/16 [phone]

## 2016-01-15 NOTE — ED Notes (Signed)
Pt head saturated new gauze and part of new chuck. Dr. Fanny Bien notified and suggested surgicell. Surgicell placed over staple on posterior head, gauze applied, pt head wrapped in gauze. Placed gauze over skin tear on L arm and wrapped in gauze. Will continue to monitor for bleeding.

## 2016-01-15 NOTE — ED Notes (Signed)
Back of pt's head still bleeding. New chuck and gauze applied to monitor bleeding.

## 2016-01-15 NOTE — ED Notes (Signed)
Laceration to back of head cleaned.

## 2016-01-15 NOTE — ED Notes (Signed)
Pt back from CT

## 2016-03-10 IMAGING — CT CT CERVICAL SPINE W/O CM
2 of 3 series · 8 of 14 positions shown, 9 images · non-contrast
Comparison: CT of the head performed 08/13/2007

CLINICAL DATA: Status post unwitnessed fall. Found on floor, with
right frontal scalp hematoma. Concern for cervical spine injury.
Initial encounter.

EXAM:
CT HEAD WITHOUT CONTRAST
CT CERVICAL SPINE WITHOUT CONTRAST
TECHNIQUE: Multidetector CT imaging of the head and cervical spine was
performed following the standard protocol without intravenous
contrast. Multiplanar CT image reconstructions of the cervical spine
were also generated.

[Series 5: c spine soft · axial · 0.32mm/px · z∈[-274,-184]mm · 4 of 76 slices shown]
[im 16/76  soft-tissue]
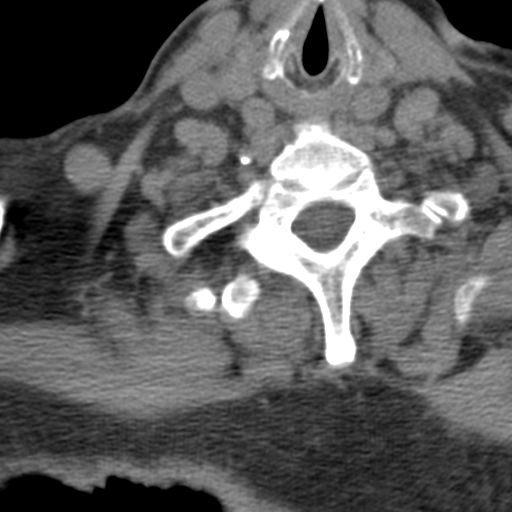
[im 31/76  soft-tissue]
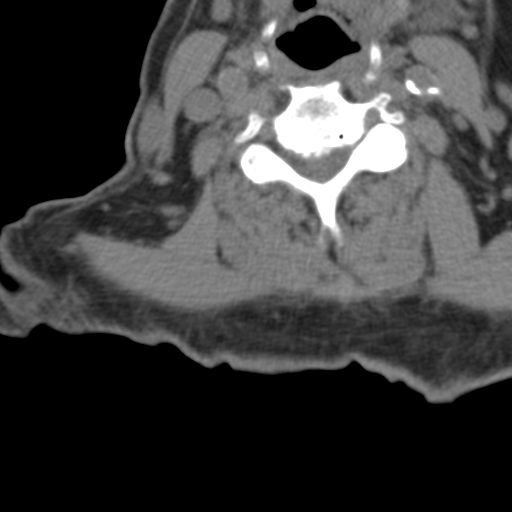
[im 46/76  soft-tissue]
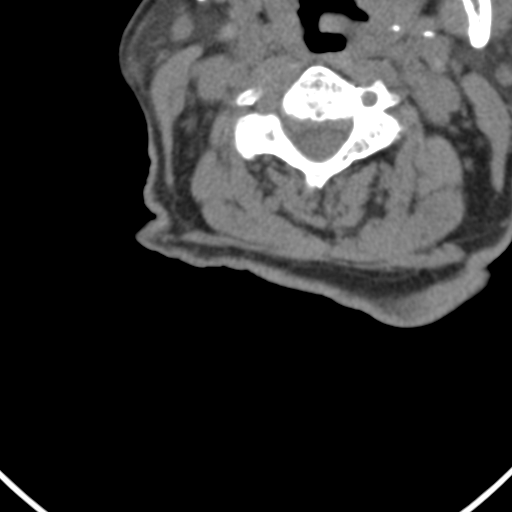
[im 61/76  soft-tissue]
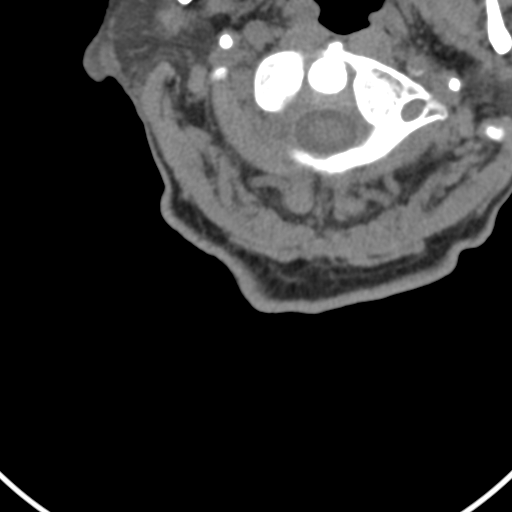

[Series 10: orthogonal axials · axial · 0.20mm/px · z∈[-288,-205]mm · 4 of 77 slices shown, 5 images]
[im 16/77  soft-tissue]
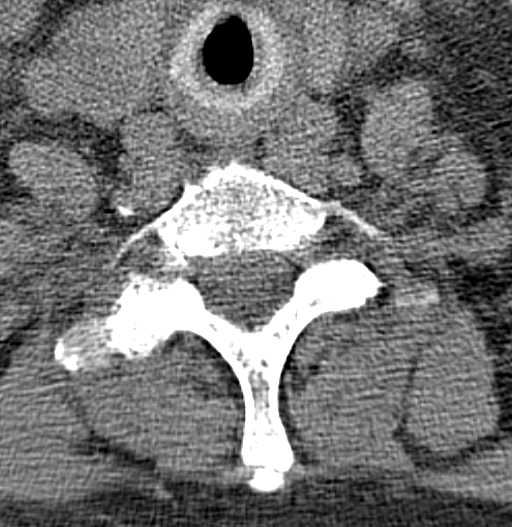
[im 16/77  bone]
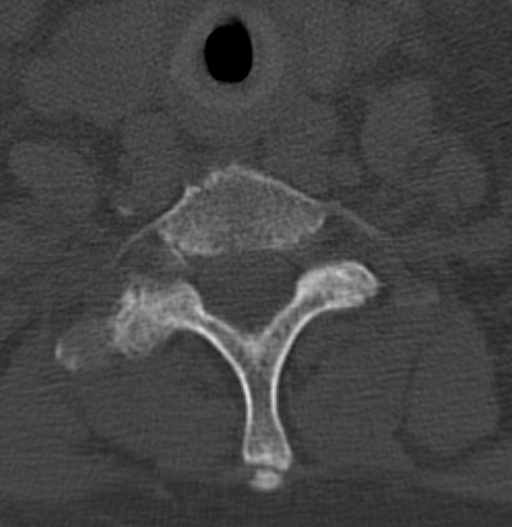
[im 31/77  bone]
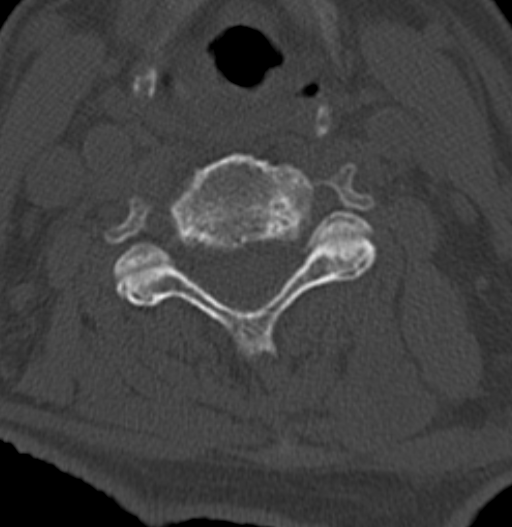
[im 46/77  bone]
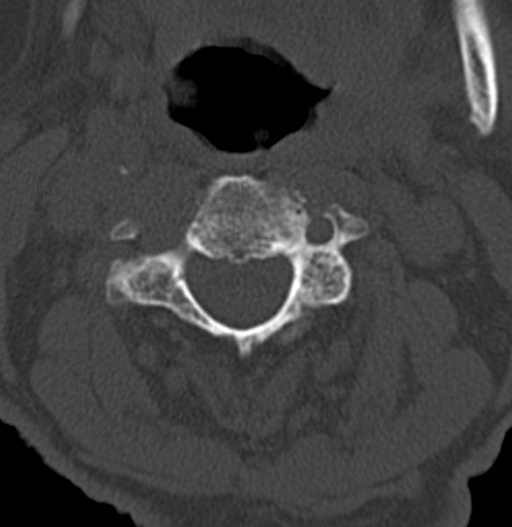
[im 61/77  bone]
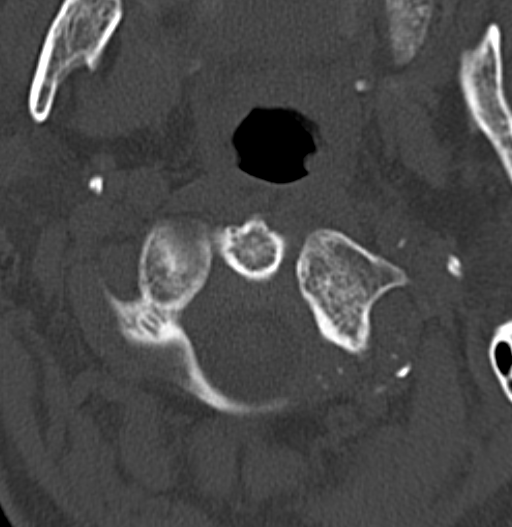

[8 of 14 positions shown; findings below may reference images not displayed]

FINDINGS: CT HEAD FINDINGS

There is no evidence of acute infarction, mass lesion, or intra- or
extra-axial hemorrhage on CT.

Prominence of the ventricles and sulci suggests mild cortical volume
loss. Mild cerebellar atrophy is noted. Scattered periventricular
and subcortical white matter change likely reflects small vessel
ischemic microangiopathy. Chronic ischemic change is noted at the
basal ganglia bilaterally.

The brainstem and fourth ventricle are within normal limits. The
cerebral hemispheres demonstrate grossly normal gray-white
differentiation. No mass effect or midline shift is seen.

There is no evidence of fracture; visualized osseous structures are
unremarkable in appearance. The visualized portions of the orbits
are within normal limits. A small amount of fluid is noted within
the right mastoid air cells. The paranasal sinuses and left mastoid
air cells are well-aerated. Soft tissue swelling is noted overlying
the right frontoparietal calvarium, with associated soft tissue
laceration and soft tissue air.

CT CERVICAL SPINE FINDINGS

There is no evidence of fracture or subluxation. Vertebral bodies
demonstrate normal height and alignment. Multilevel disc space
narrowing is noted along the cervical spine, with scattered anterior
and posterior disc osteophyte complexes. Prevertebral soft tissues
are within normal limits.

A 1.8 cm hypodensity is noted at the right thyroid lobe. The
visualized lung apices are clear. Prominent calcification is noted
at the carotid bifurcations bilaterally.
IMPRESSION: 1. No evidence of traumatic intracranial injury or fracture.
2. No evidence of fracture or subluxation along the cervical spine.
3. Soft tissue swelling overlying the right frontoparietal
calvarium, with associated soft tissue laceration and soft tissue
air.
4. Mild cortical volume loss and scattered small vessel ischemic
microangiopathy. Chronic ischemic change at the basal ganglia
bilaterally.
5. Mild diffuse degenerative change along the cervical spine.
6. Mild partial opacification of the right mastoid air cells.
7. 1.8 cm hypodensity at the right thyroid lobe. Consider further
evaluation with thyroid ultrasound. If patient is clinically
hyperthyroid, consider nuclear medicine thyroid uptake and scan.
8. Prominent calcification at the carotid bifurcations bilaterally.
Carotid ultrasound would be helpful for further evaluation, when and
as deemed clinically appropriate.

## 2016-03-25 DIAGNOSIS — I739 Peripheral vascular disease, unspecified: Secondary | ICD-10-CM | POA: Diagnosis not present

## 2016-03-25 DIAGNOSIS — N189 Chronic kidney disease, unspecified: Secondary | ICD-10-CM | POA: Diagnosis not present

## 2016-03-25 DIAGNOSIS — I5022 Chronic systolic (congestive) heart failure: Secondary | ICD-10-CM | POA: Diagnosis not present

## 2016-03-25 DIAGNOSIS — J449 Chronic obstructive pulmonary disease, unspecified: Secondary | ICD-10-CM | POA: Diagnosis not present

## 2016-03-25 DIAGNOSIS — F0390 Unspecified dementia without behavioral disturbance: Secondary | ICD-10-CM | POA: Diagnosis not present

## 2016-03-25 DIAGNOSIS — Z66 Do not resuscitate: Secondary | ICD-10-CM | POA: Diagnosis not present

## 2016-03-25 DIAGNOSIS — F5089 Other specified eating disorder: Secondary | ICD-10-CM | POA: Diagnosis not present

## 2016-03-25 DIAGNOSIS — Z515 Encounter for palliative care: Secondary | ICD-10-CM | POA: Diagnosis not present

## 2016-04-16 DEATH — deceased
